# Patient Record
Sex: Female | Born: 1965 | Race: White | Hispanic: No | Smoking: Never smoker
Health system: Southern US, Community
[De-identification: ages and names within clinical notes are randomized; demographics above are authoritative.]

## PROBLEM LIST (undated history)

## (undated) DIAGNOSIS — Z8614 Personal history of Methicillin resistant Staphylococcus aureus infection: Secondary | ICD-10-CM

## (undated) DIAGNOSIS — I10 Essential (primary) hypertension: Secondary | ICD-10-CM

## (undated) DIAGNOSIS — F419 Anxiety disorder, unspecified: Secondary | ICD-10-CM

## (undated) HISTORY — PX: ABDOMINAL HYSTERECTOMY: SHX81

## (undated) HISTORY — PX: APPENDECTOMY: SHX54

---

## 2016-01-06 DIAGNOSIS — R569 Unspecified convulsions: Secondary | ICD-10-CM

## 2016-01-06 HISTORY — DX: Unspecified convulsions: R56.9

## 2017-07-29 DIAGNOSIS — F4310 Post-traumatic stress disorder, unspecified: Secondary | ICD-10-CM | POA: Insufficient documentation

## 2017-07-29 DIAGNOSIS — G4700 Insomnia, unspecified: Secondary | ICD-10-CM | POA: Insufficient documentation

## 2017-07-29 DIAGNOSIS — G43919 Migraine, unspecified, intractable, without status migrainosus: Secondary | ICD-10-CM | POA: Insufficient documentation

## 2017-08-21 DIAGNOSIS — F419 Anxiety disorder, unspecified: Secondary | ICD-10-CM | POA: Insufficient documentation

## 2017-08-21 DIAGNOSIS — F32A Depression, unspecified: Secondary | ICD-10-CM | POA: Insufficient documentation

## 2018-02-04 ENCOUNTER — Encounter: Payer: Self-pay | Admitting: Gastroenterology

## 2018-02-28 DIAGNOSIS — G40909 Epilepsy, unspecified, not intractable, without status epilepticus: Secondary | ICD-10-CM | POA: Insufficient documentation

## 2018-03-18 ENCOUNTER — Encounter: Payer: Self-pay | Admitting: Gastroenterology

## 2018-04-05 DIAGNOSIS — G43909 Migraine, unspecified, not intractable, without status migrainosus: Secondary | ICD-10-CM | POA: Insufficient documentation

## 2018-04-08 ENCOUNTER — Encounter: Payer: Self-pay | Admitting: Gastroenterology

## 2018-08-26 DIAGNOSIS — E785 Hyperlipidemia, unspecified: Secondary | ICD-10-CM | POA: Insufficient documentation

## 2018-11-16 ENCOUNTER — Encounter: Payer: Self-pay | Admitting: Adult Health

## 2018-12-12 DIAGNOSIS — I10 Essential (primary) hypertension: Secondary | ICD-10-CM | POA: Insufficient documentation

## 2018-12-15 ENCOUNTER — Ambulatory Visit: Payer: PRIVATE HEALTH INSURANCE | Admitting: Adult Health

## 2018-12-28 ENCOUNTER — Encounter (INDEPENDENT_AMBULATORY_CARE_PROVIDER_SITE_OTHER): Payer: Self-pay

## 2018-12-28 ENCOUNTER — Encounter: Payer: Self-pay | Admitting: Adult Health

## 2018-12-28 ENCOUNTER — Ambulatory Visit (INDEPENDENT_AMBULATORY_CARE_PROVIDER_SITE_OTHER): Payer: PRIVATE HEALTH INSURANCE | Admitting: Adult Health

## 2018-12-28 ENCOUNTER — Other Ambulatory Visit: Payer: Self-pay

## 2018-12-28 VITALS — BP 158/101 | HR 100 | Ht 63.0 in | Wt 245.0 lb

## 2018-12-28 DIAGNOSIS — F331 Major depressive disorder, recurrent, moderate: Secondary | ICD-10-CM | POA: Diagnosis not present

## 2018-12-28 DIAGNOSIS — F410 Panic disorder [episodic paroxysmal anxiety] without agoraphobia: Secondary | ICD-10-CM

## 2018-12-28 DIAGNOSIS — G4700 Insomnia, unspecified: Secondary | ICD-10-CM

## 2018-12-28 DIAGNOSIS — F4310 Post-traumatic stress disorder, unspecified: Secondary | ICD-10-CM | POA: Diagnosis not present

## 2018-12-28 DIAGNOSIS — F411 Generalized anxiety disorder: Secondary | ICD-10-CM | POA: Diagnosis not present

## 2018-12-28 MED ORDER — FLUOXETINE HCL 20 MG PO CAPS: ORAL_CAPSULE | ORAL | 2 refills | Status: DC

## 2018-12-28 MED ORDER — CLONAZEPAM 2 MG PO TABS: 2.0000 mg | ORAL_TABLET | Freq: Three times a day (TID) | ORAL | 2 refills | Status: DC

## 2018-12-28 NOTE — Progress Notes (Signed)
[facility] MD/PA/NP Initial Note  12/28/2018 4:29 PM Dana Kelly  MRN:  [id number]  Chief Complaint:  Chief Complaint    Anxiety; Depression; Panic Attack; Insomnia; Trauma      HPI:   Describes mood today as "ok". Pleasant. Mood symptoms - reports depression, anxiety, and irritability. More anxious overall - having panic attacks. Stating "I was doing better, but things have flared up more lately". Changed medications recently from Prozac to Zoloft. Was taking Prozac at 60mg  and was switched to Zoloft 50mg  daily. Zoloft increased to 100mg  and she did not tolerate it and was taken back down to 50mg . Increased migraines with feeling "bad". Getting scared and "doesn't know why". Wanting to "cry". Fears being alone. Husband walked out on her and their 84 year old son 5 years ago. Son cut his wrists after he found out father was leaving. Son ok now - in college. Has moved in with his father now. Father is a . History of "some" therapy.Stable interest and motivation. Taking medications as prescribed.  Energy levels decreased. Active, does not have a regular exercise routine. Works full-time - x 16 years. Enjoys some usual interests and activities. Lives alone - staying with mother some. Talking to son more. Appetite adequate. Weight stable. Sleeps well most nights. Averages 5 to 6 hours - "dreaming the entire time". Worse since starting the Zoloft.  Focus and concentration stable. Completing tasks. Managing aspects of household.  Denies SI or HI. Denies AH or VH.  Visit Diagnosis:    ICD-10-CM   1. PTSD (post-traumatic stress disorder)  F43.10   2. Generalized anxiety disorder  F41.1   3. Major depressive disorder, recurrent episode, moderate (HCC)  F33.1   4. Insomnia, unspecified type  G47.00   5. Panic attacks  F41.0     Past Psychiatric History: Denies psychiatric hospitalization.  Past Medical History: History reviewed. No pertinent past medical history.  History reviewed. No pertinent surgical history.  Family Psychiatric History: Denies any family history of mental illness.  Family History: History reviewed. No pertinent family history.  Social History:  Social History   Socioeconomic History  . Marital status: Divorced    Spouse name: Not on file  . Number of children: Not on file  . Years of education: Not on file  . Highest education level: Not on file  Occupational History  . Not on file  Tobacco Use  . Smoking status: Never Smoker  . Smokeless tobacco: Never Used  Substance and Sexual Activity  . Alcohol use: Not on file  . Drug use: Not on file  . Sexual activity: Not on file  Other Topics Concern  . Not on file  Social History Narrative  . Not on file   Social Determinants of Health   Financial Resource Strain:   . Difficulty of Paying Living Expenses: Not on file  Food Insecurity:   . Worried About in the Last Year: Not on file  . Ran Out of Food in the Last Year: Not on file  Transportation Needs:   . Lack of Transportation (Medical): Not on file  . Lack of Transportation (Non-Medical): Not on file  Physical Activity:   . Days of Exercise per Week: Not on file  . Minutes of Exercise per Session: Not on file  Stress:   . Feeling of Stress : Not on file  Social Connections:   . Frequency of Communication with Friends and Family: Not on file  .  Frequency of Social Gatherings with Friends and Family: Not on file  . Attends Religious Services: Not on file  . Active Member of Clubs or Organizations: Not on file  . Attends BankerClub or Organization Meetings: Not on file  . Marital Status: Not on file    Allergies:  Allergies  Allergen Reactions  . Dicyclomine Hives  . Peanut-Containing Drug Products Anaphylaxis    Other reaction(s): Anaphylaxis  . Hydromorphone Hcl Other (See Comments)  . Phenytoin Sodium Extended     Decreases blood pressure  . Pregabalin Other (See Comments)    Other  reaction(s): Other (See Comments) Amnesiac episodes   . Sulfa Antibiotics   . Hydromorphone Itching    Other reaction(s): Itching  . Meloxicam Nausea And Vomiting    Metabolic Disorder Labs: No results found for: HGBA1C, MPG No results found for: PROLACTIN No results found for: CHOL, TRIG, HDL, CHOLHDL, VLDL, LDLCALC No results found for: TSH  Therapeutic Level Labs: No results found for: LITHIUM No results found for: VALPROATE No components found for:  CBMZ  Current Medications: Current Outpatient Medications  Medication Sig Dispense Refill  . amoxicillin-clavulanate (AUGMENTIN) 500-125 MG tablet amoxicillin 500 mg-potassium clavulanate 125 mg tablet    . clonazePAM (KLONOPIN) 2 MG tablet clonazepam 2 mg tablet    . furosemide (LASIX) 40 MG tablet furosemide 40 mg tablet  TAKE ONE TABLET BY MOUTH EVERY OTHER DAY AS DIRECTED    . lisinopril (ZESTRIL) 40 MG tablet lisinopril 40 mg tablet    . ondansetron (ZOFRAN-ODT) 4 MG disintegrating tablet ondansetron 4 mg disintegrating tablet    . prochlorperazine (COMPAZINE) 10 MG tablet prochlorperazine maleate 10 mg tablet    . promethazine (PHENERGAN) 25 MG tablet promethazine 25 mg tablet    . sertraline (ZOLOFT) 100 MG tablet Zoloft 100 mg tablet  Take 0.5 tablets every day by oral route.    . SUMAtriptan (IMITREX) 100 MG tablet Take by mouth.     No current facility-administered medications for this visit.    Medication Side Effects: none  Orders placed this visit:  No orders of the defined types were placed in this encounter.   Psychiatric Specialty Exam:  Review of Systems  Musculoskeletal: Negative for gait problem.  Neurological: Negative for tremors.  Psychiatric/Behavioral:       Please refer to HPI    Blood pressure (!) 158/101, pulse 100, height 5\' 3"  (1.6 m), weight 245 lb (111.1 kg).Body mass index is 43.4 kg/m.  General Appearance: Neat and Well Groomed  Eye Contact:  Good  Speech:  Clear and Coherent and  Normal Rate  Volume:  Normal  Mood:  Anxious and Depressed  Affect:  Congruent, Depressed and Flat  Thought Process:  Coherent and Descriptions of Associations: Intact  Orientation:  Full (Time, Place, and Person)  Thought Content: Logical   Suicidal Thoughts:  No  Homicidal Thoughts:  No  Memory:  WNL  Judgement:  Good  Insight:  Good  Psychomotor Activity:  Normal  Concentration:  Concentration: Good  Recall:  Good  Fund of Knowledge: Good  Language: Good  Assets:  Communication Skills Desire for Improvement Financial Resources/Insurance Housing Intimacy Leisure Time Physical Health Resilience Social Support Talents/Skills Transportation Vocational/Educational  ADL's:  Intact  Cognition: WNL  Prognosis:  Good   Screenings: Done  Receiving Psychotherapy: No   Treatment Plan/Recommendations:   Plan:  1. Clonazepam 2mg  TID  2. Zoloft 50mg  daily - take 1/2 tablet daily for 7 days, then discontinue. 3. Restart  Prozac 20mg  - plan to increase to 60mg .  Consider Adderall for ADHD - testing.  RTC 4 weeks  Patient advised to contact office with any questions, adverse effects, or acute worsening in signs and symptoms.    Aloha Gell, NP

## 2019-01-18 ENCOUNTER — Telehealth: Payer: Self-pay | Admitting: Adult Health

## 2019-01-18 NOTE — Telephone Encounter (Signed)
Called and LVM for patient to RC.

## 2019-01-18 NOTE — Telephone Encounter (Signed)
Patient called and said that she is having issues. She said that she needs something to go with the medicine you gave her. She is crying all the time and very anxious and nervous. She has an appt on 1/20 but doesn't want to wait until then to get the medicine. Her pharmacy is [facility] in [place]. Please give her a call

## 2019-01-19 ENCOUNTER — Other Ambulatory Visit: Payer: Self-pay | Admitting: Adult Health

## 2019-01-19 DIAGNOSIS — F4310 Post-traumatic stress disorder, unspecified: Secondary | ICD-10-CM

## 2019-01-19 DIAGNOSIS — G4700 Insomnia, unspecified: Secondary | ICD-10-CM

## 2019-01-19 DIAGNOSIS — F411 Generalized anxiety disorder: Secondary | ICD-10-CM

## 2019-01-19 MED ORDER — OLANZAPINE 5 MG PO TABS: 5.0000 mg | ORAL_TABLET | Freq: Every day | ORAL | 0 refills | Status: DC

## 2019-01-19 NOTE — Telephone Encounter (Signed)
Pt returned call from yesterday. Please call her back @ [phone].

## 2019-01-19 NOTE — Telephone Encounter (Signed)
Called and spoke w patient.

## 2019-01-25 ENCOUNTER — Encounter: Payer: Self-pay | Admitting: Adult Health

## 2019-01-25 ENCOUNTER — Other Ambulatory Visit: Payer: Self-pay

## 2019-01-25 ENCOUNTER — Ambulatory Visit (INDEPENDENT_AMBULATORY_CARE_PROVIDER_SITE_OTHER): Payer: PRIVATE HEALTH INSURANCE | Admitting: Adult Health

## 2019-01-25 DIAGNOSIS — F411 Generalized anxiety disorder: Secondary | ICD-10-CM

## 2019-01-25 DIAGNOSIS — F331 Major depressive disorder, recurrent, moderate: Secondary | ICD-10-CM | POA: Diagnosis not present

## 2019-01-25 DIAGNOSIS — G4700 Insomnia, unspecified: Secondary | ICD-10-CM

## 2019-01-25 DIAGNOSIS — F4310 Post-traumatic stress disorder, unspecified: Secondary | ICD-10-CM

## 2019-01-25 DIAGNOSIS — F410 Panic disorder [episodic paroxysmal anxiety] without agoraphobia: Secondary | ICD-10-CM | POA: Diagnosis not present

## 2019-01-25 DIAGNOSIS — R4689 Other symptoms and signs involving appearance and behavior: Secondary | ICD-10-CM | POA: Insufficient documentation

## 2019-01-25 DIAGNOSIS — F909 Attention-deficit hyperactivity disorder, unspecified type: Secondary | ICD-10-CM

## 2019-01-25 DIAGNOSIS — R404 Transient alteration of awareness: Secondary | ICD-10-CM | POA: Insufficient documentation

## 2019-01-25 MED ORDER — TRAZODONE HCL 100 MG PO TABS: ORAL_TABLET | ORAL | 2 refills | Status: DC

## 2019-01-25 MED ORDER — LISDEXAMFETAMINE DIMESYLATE 30 MG PO CAPS: 30.0000 mg | ORAL_CAPSULE | Freq: Every day | ORAL | 0 refills | Status: DC

## 2019-01-25 NOTE — Progress Notes (Signed)
Dana Kelly [id number] [birth year] 54 y.o.  Subjective:   Patient ID:  Dana Kelly is a 54 y.o. (DOB 03/03/1965) female.  Chief Complaint:  Chief Complaint  Patient presents with  . Depression  . Anxiety  . ADHD  . Insomnia  . Panic Attack  . Other    PTSD    HPI Dana Kelly presents to the office today for follow-up of MDD, GAD, insomnia, panic attacks, ADHD, and PTSD.  Describes mood today as "ok". Pleasant. Denies tearfulness x 2 weeks. Mood symptoms - reports decreased depression, anxiety, and irritability. More anxious overall. Having having panic attacks. Stating "I feel a little better". One Migraine since last visit -"had to get a shot for it". Took Zyprexa for 2 days - "made her too draggy during the day and didn't sleep well at night". Has taken some "old Trazadone to help with sleep". Would like to try medication for ADHD. Feels like she is "struggling" at work. Concerns of losing her job. Was trialed on Ritalin previously, but insurance refused to cover it. Stable interest and motivation. Taking medications as prescribed.  Energy levels decreased. Active, does not have a regular exercise routine. 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 Lives alone. Stays with mother some days. Talking to son more. Appetite adequate. Weight stable - 245. Sleeps well most nights. Averages 4 to 5 hours. Focus and concentration difficulties. Completing tasks. Managing aspects of household. Difficulties in work setting - "getting into trouble".  Denies SI or HI. Denies AH or VH.  Review of Systems:  Review of Systems  Musculoskeletal: Negative for gait problem.  Neurological: Negative for tremors.  Psychiatric/Behavioral:       Please refer to HPI    Medications: I have reviewed the patient's current medications.  Current Outpatient Medications  Medication Sig Dispense Refill  . amoxicillin-clavulanate (AUGMENTIN) 500-125 MG  tablet amoxicillin 500 mg-potassium clavulanate 125 mg tablet    . clonazePAM (KLONOPIN) 2 MG tablet Take 1 tablet (2 mg total) by mouth 3 (three) times daily. 90 tablet 2  . FLUoxetine (PROZAC) 20 MG capsule Take 3 capsules daily. 90 capsule 2  . furosemide (LASIX) 40 MG tablet furosemide 40 mg tablet  TAKE ONE TABLET BY MOUTH EVERY OTHER DAY AS DIRECTED    . lisdexamfetamine (VYVANSE) 30 MG capsule Take 1 capsule (30 mg total) by mouth daily. 30 capsule 0  . lisinopril (ZESTRIL) 40 MG tablet lisinopril 40 mg tablet    . ondansetron (ZOFRAN-ODT) 4 MG disintegrating tablet ondansetron 4 mg disintegrating tablet    . prochlorperazine (COMPAZINE) 10 MG tablet prochlorperazine maleate 10 mg tablet    . promethazine (PHENERGAN) 25 MG tablet promethazine 25 mg tablet    . SUMAtriptan (IMITREX) 100 MG tablet Take by mouth.    . traZODone (DESYREL) 100 MG tablet Take two tablets at bedtime. 60 tablet 2   No current facility-administered medications for this visit.    Medication Side Effects: None  Allergies:  Allergies  Allergen Reactions  . Dicyclomine Hives  . Peanut-Containing Drug Products Anaphylaxis    Other reaction(s): Anaphylaxis  . Hydromorphone Hcl Other (See Comments)  . Phenytoin Sodium Extended     Decreases blood pressure  . Pregabalin Other (See Comments)    Other reaction(s): Other (See Comments) Amnesiac episodes   . Sulfa Antibiotics   . Hydromorphone Itching    Other reaction(s): Itching  . Meloxicam Nausea And Vomiting    No past  medical history on file.  No family history on file.  Social History   Socioeconomic History  . Marital status: Divorced    Spouse name: Not on file  . Number of children: Not on file  . Years of education: Not on file  . Highest education level: Not on file  Occupational History  . Not on file  Tobacco Use  . Smoking status: Never Smoker  . Smokeless tobacco: Never Used  Substance and Sexual Activity  . Alcohol use: Not  on file  . Drug use: Not on file  . Sexual activity: Not on file  Other Topics Concern  . Not on file  Social History Narrative  . Not on file   Social Determinants of Health   Financial Resource Strain:   . Difficulty of Paying Living Expenses: Not on file  Food Insecurity:   . Worried About Charity fundraiser in the Last Year: Not on file  . Ran Out of Food in the Last Year: Not on file  Transportation Needs:   . Lack of Transportation (Medical): Not on file  . Lack of Transportation (Non-Medical): Not on file  Physical Activity:   . Days of Exercise per Week: Not on file  . Minutes of Exercise per Session: Not on file  Stress:   . Feeling of Stress : Not on file  Social Connections:   . Frequency of Communication with Friends and Family: Not on file  . Frequency of Social Gatherings with Friends and Family: Not on file  . Attends Religious Services: Not on file  . Active Member of Clubs or Organizations: Not on file  . Attends Archivist Meetings: Not on file  . Marital Status: Not on file  Intimate Partner Violence:   . Fear of Current or Ex-Partner: Not on file  . Emotionally Abused: Not on file  . Physically Abused: Not on file  . Sexually Abused: Not on file    Past Medical History, Surgical history, Social history, and Family history were reviewed and updated as appropriate.   Please see review of systems for further details on the patient's review from today.   Objective:   Physical Exam:  There were no vitals taken for this visit.  Physical Exam Constitutional:      General: She is not in acute distress.    Appearance: She is well-developed.  Musculoskeletal:        General: No deformity.  Neurological:     Mental Status: She is alert and oriented to person, place, and time.     Coordination: Coordination normal.  Psychiatric:        Attention and Perception: Attention and perception normal. She does not perceive auditory or visual  hallucinations.        Mood and Affect: Mood normal. Mood is not anxious or depressed. Affect is not labile, blunt, angry or inappropriate.        Speech: Speech normal.        Behavior: Behavior normal.        Thought Content: Thought content normal. Thought content is not paranoid or delusional. Thought content does not include homicidal or suicidal ideation. Thought content does not include homicidal or suicidal plan.        Cognition and Memory: Cognition and memory normal.        Judgment: Judgment normal.     Comments: Insight intact     Lab Review:  No results found for: NA, K, CL, CO2,  GLUCOSE, BUN, CREATININE, CALCIUM, PROT, ALBUMIN, AST, ALT, ALKPHOS, BILITOT, GFRNONAA, GFRAA  No results found for: WBC, RBC, HGB, HCT, PLT, MCV, MCH, MCHC, RDW, LYMPHSABS, MONOABS, EOSABS, BASOSABS  No results found for: POCLITH, LITHIUM   No results found for: PHENYTOIN, PHENOBARB, VALPROATE, CBMZ   .res Assessment: Plan:    Plan:  Clonazepam 2mg  TID  Prozac 60mg  Add Vyvanse 30mg  daily Add Trazdone 100mg  at hs - take one to 2 tablets.  Consider Adderall for ADHD - testing completed. Testing brought to appointment. RTC 4 weeks  Patient advised to contact office with any questions, adverse effects, or acute worsening in signs and symptoms.   Jaid was seen today for depression, anxiety, adhd, insomnia, panic attack and other.  Diagnoses and all orders for this visit:  Panic attacks  Major depressive disorder, recurrent episode, moderate (HCC)  Generalized anxiety disorder  Insomnia, unspecified type -     traZODone (DESYREL) 100 MG tablet; Take two tablets at bedtime.  PTSD (post-traumatic stress disorder)  Attention deficit hyperactivity disorder (ADHD), unspecified ADHD type -     lisdexamfetamine (VYVANSE) 30 MG capsule; Take 1 capsule (30 mg total) by mouth daily.     Please see After Visit Summary for patient specific instructions.  Future Appointments  Date  Time Provider Department Center  02/22/2019  2:40 PM Sharin Altidor, , NP [facility] None    No orders of the defined types were placed in this encounter.   -------------------------------

## 2019-01-26 ENCOUNTER — Telehealth: Payer: Self-pay | Admitting: Adult Health

## 2019-01-26 DIAGNOSIS — F909 Attention-deficit hyperactivity disorder, unspecified type: Secondary | ICD-10-CM

## 2019-01-26 MED ORDER — AMPHETAMINE-DEXTROAMPHETAMINE 20 MG PO TABS: 20.0000 mg | ORAL_TABLET | Freq: Every day | ORAL | 0 refills | Status: DC

## 2019-01-26 NOTE — Telephone Encounter (Signed)
I will send in Adderall 20mg  daily - start with 1/2 tablet and then increase to one tablet.

## 2019-01-26 NOTE — Telephone Encounter (Signed)
We can - would she want to try an Adderall XR 20mg ?

## 2019-01-26 NOTE — Telephone Encounter (Signed)
Patient given instructions and to follow up with an update

## 2019-01-26 NOTE — Telephone Encounter (Signed)
Pharmacy, [facility] in [place], says she can't get Vyvvanse due to insurance not covering, She  Has to try Adderall first per insurance. Can we sent that in?

## 2019-02-09 ENCOUNTER — Telehealth: Payer: Self-pay | Admitting: Adult Health

## 2019-02-09 NOTE — Telephone Encounter (Signed)
Aida Puffer left message from Clinic in [place] about Keonta Monceaux asking what meds she should be taking. RETURN CALL TO [phone]

## 2019-02-09 NOTE — Telephone Encounter (Signed)
I don't have her on either of those. Stopped Zyprexa. Never started Prazosin.

## 2019-02-09 NOTE — Telephone Encounter (Signed)
Can we get some clarity on this request. Patient should be able to relay that information.

## 2019-02-09 NOTE — Telephone Encounter (Signed)
I didn't have her on it, but she may be taking it.

## 2019-02-10 NOTE — Telephone Encounter (Signed)
Rtc to Dana Kelly to discuss medications but they were having computer issues and she was unable to talk. They will have her call back when she gets a chance.

## 2019-02-10 NOTE — Telephone Encounter (Signed)
Spoke with Jacki Cones this morning, she will be going by patient's house today and over the weekend to follow up on her sepsis. She will have her discontinue the Zyprexa. She will also discuss with her if she still needs the prazosin for nightmares, she may have her omit it this weekend to do a trial to see if it is needed. She will then follow up and let us know on Monday.

## 2019-02-22 ENCOUNTER — Encounter: Payer: Self-pay | Admitting: Adult Health

## 2019-02-22 ENCOUNTER — Ambulatory Visit (INDEPENDENT_AMBULATORY_CARE_PROVIDER_SITE_OTHER): Payer: PRIVATE HEALTH INSURANCE | Admitting: Adult Health

## 2019-02-22 DIAGNOSIS — F331 Major depressive disorder, recurrent, moderate: Secondary | ICD-10-CM | POA: Diagnosis not present

## 2019-02-22 DIAGNOSIS — F411 Generalized anxiety disorder: Secondary | ICD-10-CM | POA: Diagnosis not present

## 2019-02-22 DIAGNOSIS — F4310 Post-traumatic stress disorder, unspecified: Secondary | ICD-10-CM

## 2019-02-22 DIAGNOSIS — F909 Attention-deficit hyperactivity disorder, unspecified type: Secondary | ICD-10-CM

## 2019-02-22 DIAGNOSIS — G4700 Insomnia, unspecified: Secondary | ICD-10-CM | POA: Diagnosis not present

## 2019-02-22 DIAGNOSIS — F410 Panic disorder [episodic paroxysmal anxiety] without agoraphobia: Secondary | ICD-10-CM

## 2019-02-22 MED ORDER — AMPHETAMINE-DEXTROAMPHETAMINE 30 MG PO TABS: 30.0000 mg | ORAL_TABLET | Freq: Every day | ORAL | 0 refills | Status: DC

## 2019-02-22 NOTE — Progress Notes (Signed)
Dana Kelly [id number] July 26, 1965 54 y.o.  Virtual Visit via Telephone Note  I connected with pt on 02/22/19 at  2:40 PM EST by telephone and verified that I am speaking with the correct person using two identifiers.   I discussed the limitations, risks, security and privacy concerns of performing an evaluation and management service by telephone and the availability of in person appointments. I also discussed with the patient that there may be a patient responsible charge related to this service. The patient expressed understanding and agreed to proceed.   I discussed the assessment and treatment plan with the patient. The patient was provided an opportunity to ask questions and all were answered. The patient agreed with the plan and demonstrated an understanding of the instructions.   The patient was advised to call back or seek an in-person evaluation if the symptoms worsen or if the condition fails to improve as anticipated.  I provided 30 minutes of non-face-to-face time during this encounter.  The patient was located at home.  The provider was located at [facility].   [name], NP   Subjective:   Patient ID:  Dana Kelly is a 54 y.o. (DOB 04-14-1965) female.  Chief Complaint: No chief complaint on file.   HPI Dana Kelly presents for follow-up of MDD, GAD, insomnia, panic attacks, ADHD, and PTSD.  Describes mood today as "ok". Pleasant. Denies tearfulness x 2 weeks. Mood symptoms - reports decreased depression, anxiety, and irritability. Stating "I'm moving towards getting better". Recently hospitalized for a week for Sepsis. Was out of work for 2 weeks. Returned to work a week ago. Feels like she is "recovering well". Still feeling tired and fatigued. Able to work, then going home and going to bed. Following up with PCP. Stable interest and motivation. Taking medications as prescribed.  Energy levels low. Active, does not have a regular  exercise routine. 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 Lives alone. Stays with mother some days. Talking to son more. Appetite adequate. Weight loss while hospitalized - 240 pounds. Sleeps well most nights. Averages 4 to 5 hours. Waking up "off and on". Having nightmares.  Focus and concentration difficulties. Completing tasks. Managing aspects of household. Work going well.  Denies SI or HI. Denies AH or VH.  Review of Systems:  Review of Systems  Musculoskeletal: Negative for gait problem.  Neurological: Negative for tremors.  Psychiatric/Behavioral:       Please refer to HPI    Medications: I have reviewed the patient's current medications.  Current Outpatient Medications  Medication Sig Dispense Refill  . amoxicillin-clavulanate (AUGMENTIN) 500-125 MG tablet amoxicillin 500 mg-potassium clavulanate 125 mg tablet    . amphetamine-dextroamphetamine (ADDERALL) 20 MG tablet Take 1 tablet (20 mg total) by mouth daily. 30 tablet 0  . clonazePAM (KLONOPIN) 2 MG tablet Take 1 tablet (2 mg total) by mouth 3 (three) times daily. 90 tablet 2  . FLUoxetine (PROZAC) 20 MG capsule Take 3 capsules daily. 90 capsule 2  . furosemide (LASIX) 40 MG tablet furosemide 40 mg tablet  TAKE ONE TABLET BY MOUTH EVERY OTHER DAY AS DIRECTED    . lisinopril (ZESTRIL) 40 MG tablet lisinopril 40 mg tablet    . ondansetron (ZOFRAN-ODT) 4 MG disintegrating tablet ondansetron 4 mg disintegrating tablet    . prochlorperazine (COMPAZINE) 10 MG tablet prochlorperazine maleate 10 mg tablet    . promethazine (PHENERGAN) 25 MG tablet promethazine 25 mg tablet    .  SUMAtriptan (IMITREX) 100 MG tablet Take by mouth.    . traZODone (DESYREL) 100 MG tablet Take two tablets at bedtime. 60 tablet 2   No current facility-administered medications for this visit.    Medication Side Effects: None  Allergies:  Allergies  Allergen Reactions  . Dicyclomine Hives  .  Peanut-Containing Drug Products Anaphylaxis    Other reaction(s): Anaphylaxis  . Hydromorphone Hcl Other (See Comments)  . Phenytoin Sodium Extended     Decreases blood pressure  . Pregabalin Other (See Comments)    Other reaction(s): Other (See Comments) Amnesiac episodes   . Sulfa Antibiotics   . Hydromorphone Itching    Other reaction(s): Itching  . Meloxicam Nausea And Vomiting    No past medical history on file.  No family history on file.  Social History   Socioeconomic History  . Marital status: Divorced    Spouse name: Not on file  . Number of children: Not on file  . Years of education: Not on file  . Highest education level: Not on file  Occupational History  . Not on file  Tobacco Use  . Smoking status: Never Smoker  . Smokeless tobacco: Never Used  Substance and Sexual Activity  . Alcohol use: Not on file  . Drug use: Not on file  . Sexual activity: Not on file  Other Topics Concern  . Not on file  Social History Narrative  . Not on file   Social Determinants of Health   Financial Resource Strain:   . Difficulty of Paying Living Expenses: Not on file  Food Insecurity:   . Worried About Charity fundraiser in the Last Year: Not on file  . Ran Out of Food in the Last Year: Not on file  Transportation Needs:   . Lack of Transportation (Medical): Not on file  . Lack of Transportation (Non-Medical): Not on file  Physical Activity:   . Days of Exercise per Week: Not on file  . Minutes of Exercise per Session: Not on file  Stress:   . Feeling of Stress : Not on file  Social Connections:   . Frequency of Communication with Friends and Family: Not on file  . Frequency of Social Gatherings with Friends and Family: Not on file  . Attends Religious Services: Not on file  . Active Member of Clubs or Organizations: Not on file  . Attends Archivist Meetings: Not on file  . Marital Status: Not on file  Intimate Partner Violence:   . Fear of  Current or Ex-Partner: Not on file  . Emotionally Abused: Not on file  . Physically Abused: Not on file  . Sexually Abused: Not on file    Past Medical History, Surgical history, Social history, and Family history were reviewed and updated as appropriate.   Please see review of systems for further details on the patient's review from today.   Objective:   Physical Exam:  There were no vitals taken for this visit.  Physical Exam Constitutional:      General: She is not in acute distress.    Appearance: She is well-developed.  Musculoskeletal:        General: No deformity.  Neurological:     Mental Status: She is alert and oriented to person, place, and time.     Coordination: Coordination normal.  Psychiatric:        Attention and Perception: Attention and perception normal. She does not perceive auditory or visual hallucinations.  Mood and Affect: Mood normal. Mood is not anxious or depressed. Affect is not labile, blunt, angry or inappropriate.        Speech: Speech normal.        Behavior: Behavior normal.        Thought Content: Thought content normal. Thought content is not paranoid or delusional. Thought content does not include homicidal or suicidal ideation. Thought content does not include homicidal or suicidal plan.        Cognition and Memory: Cognition and memory normal.        Judgment: Judgment normal.     Comments: Insight intact     Lab Review:  No results found for: NA, K, CL, CO2, GLUCOSE, BUN, CREATININE, CALCIUM, PROT, ALBUMIN, AST, ALT, ALKPHOS, BILITOT, GFRNONAA, GFRAA  No results found for: WBC, RBC, HGB, HCT, PLT, MCV, MCH, MCHC, RDW, LYMPHSABS, MONOABS, EOSABS, BASOSABS  No results found for: POCLITH, LITHIUM   No results found for: PHENYTOIN, PHENOBARB, VALPROATE, CBMZ   .res Assessment: Plan:    Plan:  Clonazepam 2mg  TID  Prozac 60mg  Increase Adderall 20mg  to 30mg  daily Trazdone 100mg  at hs - take one to 2 tablets.  RTC 4  weeks  Patient advised to contact office with any questions, adverse effects, or acute worsening in signs and symptoms.  Discussed potential benefits, risk, and side effects of benzodiazepines to include potential risk of tolerance and dependence, as well as possible drowsiness.  Advised patient not to drive if experiencing drowsiness and to take lowest possible effective dose to minimize risk of dependence and tolerance.  Discussed potential metabolic side effects associated with atypical antipsychotics, as well as potential risk for movement side effects. Advised pt to contact office if movement side effects occur.   Diagnoses and all orders for this visit:  PTSD (post-traumatic stress disorder)  Insomnia, unspecified type  Generalized anxiety disorder  Major depressive disorder, recurrent episode, moderate (HCC)  Panic attacks  Attention deficit hyperactivity disorder (ADHD), unspecified ADHD type    Please see After Visit Summary for patient specific instructions.  No future appointments.  No orders of the defined types were placed in this encounter.     -------------------------------

## 2019-03-22 ENCOUNTER — Ambulatory Visit (INDEPENDENT_AMBULATORY_CARE_PROVIDER_SITE_OTHER): Payer: PRIVATE HEALTH INSURANCE | Admitting: Adult Health

## 2019-03-22 ENCOUNTER — Encounter: Payer: Self-pay | Admitting: Adult Health

## 2019-03-22 DIAGNOSIS — F410 Panic disorder [episodic paroxysmal anxiety] without agoraphobia: Secondary | ICD-10-CM | POA: Diagnosis not present

## 2019-03-22 DIAGNOSIS — F4310 Post-traumatic stress disorder, unspecified: Secondary | ICD-10-CM

## 2019-03-22 DIAGNOSIS — G4700 Insomnia, unspecified: Secondary | ICD-10-CM | POA: Diagnosis not present

## 2019-03-22 DIAGNOSIS — F331 Major depressive disorder, recurrent, moderate: Secondary | ICD-10-CM | POA: Diagnosis not present

## 2019-03-22 DIAGNOSIS — F411 Generalized anxiety disorder: Secondary | ICD-10-CM

## 2019-03-22 DIAGNOSIS — F909 Attention-deficit hyperactivity disorder, unspecified type: Secondary | ICD-10-CM

## 2019-03-22 MED ORDER — AMPHETAMINE-DEXTROAMPHETAMINE 30 MG PO TABS: 30.0000 mg | ORAL_TABLET | Freq: Every day | ORAL | 0 refills | Status: DC

## 2019-03-22 MED ORDER — CLONAZEPAM 2 MG PO TABS: 2.0000 mg | ORAL_TABLET | Freq: Three times a day (TID) | ORAL | 2 refills | Status: DC

## 2019-03-22 MED ORDER — FLUOXETINE HCL 20 MG PO CAPS: ORAL_CAPSULE | ORAL | 2 refills | Status: DC

## 2019-03-22 MED ORDER — TRAZODONE HCL 100 MG PO TABS: ORAL_TABLET | ORAL | 2 refills | Status: DC

## 2019-03-22 NOTE — Progress Notes (Signed)
Dana Kelly [id number] [birth year] 54 y.o.  Virtual Visit via Telephone Note  I connected with pt on 03/22/19 at  4:40 PM EDT by telephone and verified that I am speaking with the correct person using two identifiers.   I discussed the limitations, risks, security and privacy concerns of performing an evaluation and management service by telephone and the availability of in person appointments. I also discussed with the patient that there may be a patient responsible charge related to this service. The patient expressed understanding and agreed to proceed.   I discussed the assessment and treatment plan with the patient. The patient was provided an opportunity to ask questions and all were answered. The patient agreed with the plan and demonstrated an understanding of the instructions.   The patient was advised to call back or seek an in-person evaluation if the symptoms worsen or if the condition fails to improve as anticipated.  I provided 30 minutes of non-face-to-face time during this encounter.  The patient was located at home.  The provider was located at [facility].   [name], NP   Subjective:   Patient ID:  Dana Kelly is a 54 y.o. (DOB Mar 27, 1965) female.  Chief Complaint: No chief complaint on file.   HPI Dana Kelly presents for follow-up of MDD, GAD, insomnia, panic attacks, ADHD, and PTSD.  Describes mood today as "ok". Pleasant. Denies tearfulness x 2 weeks. Mood symptoms - reports decreased depression, anxiety, and irritability. Stating "I feel like I'm getting back into full swing. Getting a "little anxious" now and then. Stating "the periods of crying are getting farther and farther apart". Stating "I'm not having any crying spells". Feels tired and fatigued - 2 blood clots in lungs - "will take a year to recover". Following up with PCP. Stable interest and motivation. Taking medications as prescribed.  Energy levels low. Active,  does not have a regular exercise routine. 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 Lives alone. Has started "talking to someone".  Appetite adequate. Weight stable - 240 pounds. Sleeps well most nights. Averages 5 to 6 hours. Waking up during the night for no particular reason. Decreased nightmares.  Focus and concentration difficulties. Completing tasks. Managing aspects of household. Work going well.  Denies SI or HI. Denies AH or VH.  Review of Systems:  Review of Systems  Musculoskeletal: Negative for gait problem.  Neurological: Negative for tremors.  Psychiatric/Behavioral:       Please refer to HPI    Medications: I have reviewed the patient's current medications.  Current Outpatient Medications  Medication Sig Dispense Refill  . amoxicillin-clavulanate (AUGMENTIN) 500-125 MG tablet amoxicillin 500 mg-potassium clavulanate 125 mg tablet    . amphetamine-dextroamphetamine (ADDERALL) 30 MG tablet Take 1 tablet by mouth daily. 30 tablet 0  . apixaban (ELIQUIS) 5 MG TABS tablet Eliquis 5 mg tablet  1 tab BID    . clonazePAM (KLONOPIN) 2 MG tablet Take 1 tablet (2 mg total) by mouth 3 (three) times daily. 90 tablet 2  . FLUoxetine (PROZAC) 20 MG capsule Take 3 capsules daily. 90 capsule 2  . furosemide (LASIX) 40 MG tablet furosemide 40 mg tablet  TAKE ONE TABLET BY MOUTH EVERY OTHER DAY AS DIRECTED    . lisinopril (ZESTRIL) 40 MG tablet lisinopril 40 mg tablet    . ondansetron (ZOFRAN-ODT) 4 MG disintegrating tablet ondansetron 4 mg disintegrating tablet    . prazosin (MINIPRESS) 2 MG capsule prazosin  2 mg capsule    . prochlorperazine (COMPAZINE) 10 MG tablet prochlorperazine maleate 10 mg tablet    . promethazine (PHENERGAN) 25 MG tablet promethazine 25 mg tablet    . SUMAtriptan (IMITREX) 100 MG tablet Take by mouth.    . traZODone (DESYREL) 100 MG tablet Take two tablets at bedtime. 60 tablet 2   No current facility-administered  medications for this visit.    Medication Side Effects: None  Allergies:  Allergies  Allergen Reactions  . Dicyclomine Hives  . Peanut-Containing Drug Products Anaphylaxis    Other reaction(s): Anaphylaxis  . Hydromorphone Hcl Other (See Comments)  . Phenytoin Sodium Extended     Decreases blood pressure  . Pregabalin Other (See Comments)    Other reaction(s): Other (See Comments) Amnesiac episodes   . Sulfa Antibiotics   . Hydromorphone Itching    Other reaction(s): Itching  . Meloxicam Nausea And Vomiting    No past medical history on file.  No family history on file.  Social History   Socioeconomic History  . Marital status: Divorced    Spouse name: Not on file  . Number of children: Not on file  . Years of education: Not on file  . Highest education level: Not on file  Occupational History  . Not on file  Tobacco Use  . Smoking status: Never Smoker  . Smokeless tobacco: Never Used  Substance and Sexual Activity  . Alcohol use: Not on file  . Drug use: Not on file  . Sexual activity: Not on file  Other Topics Concern  . Not on file  Social History Narrative  . Not on file   Social Determinants of Health   Financial Resource Strain:   . Difficulty of Paying Living Expenses:   Food Insecurity:   . Worried About Programme researcher, broadcasting/film/video in the Last Year:   . Barista in the Last Year:   Transportation Needs:   . Freight forwarder (Medical):   Marland Kitchen Lack of Transportation (Non-Medical):   Physical Activity:   . Days of Exercise per Week:   . Minutes of Exercise per Session:   Stress:   . Feeling of Stress :   Social Connections:   . Frequency of Communication with Friends and Family:   . Frequency of Social Gatherings with Friends and Family:   . Attends Religious Services:   . Active Member of Clubs or Organizations:   . Attends Banker Meetings:   Marland Kitchen Marital Status:   Intimate Partner Violence:   . Fear of Current or Ex-Partner:    . Emotionally Abused:   Marland Kitchen Physically Abused:   . Sexually Abused:     Past Medical History, Surgical history, Social history, and Family history were reviewed and updated as appropriate.   Please see review of systems for further details on the patient's review from today.   Objective:   Physical Exam:  There were no vitals taken for this visit.  Physical Exam Constitutional:      General: She is not in acute distress. Musculoskeletal:        General: No deformity.  Neurological:     Mental Status: She is alert and oriented to person, place, and time.     Coordination: Coordination normal.  Psychiatric:        Attention and Perception: Attention and perception normal. She does not perceive auditory or visual hallucinations.        Mood and Affect: Mood normal. Mood  is not anxious or depressed. Affect is not labile, blunt, angry or inappropriate.        Speech: Speech normal.        Behavior: Behavior normal.        Thought Content: Thought content normal. Thought content is not paranoid or delusional. Thought content does not include homicidal or suicidal ideation. Thought content does not include homicidal or suicidal plan.        Cognition and Memory: Cognition and memory normal.        Judgment: Judgment normal.     Comments: Insight intact     Lab Review:  No results found for: NA, K, CL, CO2, GLUCOSE, BUN, CREATININE, CALCIUM, PROT, ALBUMIN, AST, ALT, ALKPHOS, BILITOT, GFRNONAA, GFRAA  No results found for: WBC, RBC, HGB, HCT, PLT, MCV, MCH, MCHC, RDW, LYMPHSABS, MONOABS, EOSABS, BASOSABS  No results found for: POCLITH, LITHIUM   No results found for: PHENYTOIN, PHENOBARB, VALPROATE, CBMZ   .res Assessment: Plan:    Plan:  Clonazepam 2mg  TID  Prozac 60mg  Adderall 30mg  daily Trazdone 100mg  at hs - take one to 3 tablets.  RTC 4 weeks  Patient advised to contact office with any questions, adverse effects, or acute worsening in signs and  symptoms.  Discussed potential benefits, risk, and side effects of benzodiazepines to include potential risk of tolerance and dependence, as well as possible drowsiness.  Advised patient not to drive if experiencing drowsiness and to take lowest possible effective dose to minimize risk of dependence and tolerance.  Discussed potential metabolic side effects associated with atypical antipsychotics, as well as potential risk for movement side effects. Advised pt to contact office if movement side effects occur.   There are no diagnoses linked to this encounter.  Please see After Visit Summary for patient specific instructions.  No future appointments.  No orders of the defined types were placed in this encounter.     -------------------------------

## 2019-05-24 ENCOUNTER — Encounter: Payer: Self-pay | Admitting: Adult Health

## 2019-05-24 ENCOUNTER — Telehealth (INDEPENDENT_AMBULATORY_CARE_PROVIDER_SITE_OTHER): Payer: PRIVATE HEALTH INSURANCE | Admitting: Adult Health

## 2019-05-24 DIAGNOSIS — F410 Panic disorder [episodic paroxysmal anxiety] without agoraphobia: Secondary | ICD-10-CM

## 2019-05-24 DIAGNOSIS — F411 Generalized anxiety disorder: Secondary | ICD-10-CM

## 2019-05-24 DIAGNOSIS — G4700 Insomnia, unspecified: Secondary | ICD-10-CM

## 2019-05-24 DIAGNOSIS — F4310 Post-traumatic stress disorder, unspecified: Secondary | ICD-10-CM | POA: Diagnosis not present

## 2019-05-24 DIAGNOSIS — F909 Attention-deficit hyperactivity disorder, unspecified type: Secondary | ICD-10-CM | POA: Diagnosis not present

## 2019-05-24 DIAGNOSIS — F331 Major depressive disorder, recurrent, moderate: Secondary | ICD-10-CM | POA: Diagnosis not present

## 2019-05-24 NOTE — Progress Notes (Signed)
JALEEAH Kelly [id number] 1965/04/26 54 y.o.  Virtual Visit via Telephone Note  I connected with pt on 05/24/19 at  5:00 PM EDT by telephone and verified that I am speaking with the correct person using two identifiers.   I discussed the limitations, risks, security and privacy concerns of performing an evaluation and management service by telephone and the availability of in person appointments. I also discussed with the patient that there may be a patient responsible charge related to this service. The patient expressed understanding and agreed to proceed.   I discussed the assessment and treatment plan with the patient. The patient was provided an opportunity to ask questions and all were answered. The patient agreed with the plan and demonstrated an understanding of the instructions.   The patient was advised to call back or seek an in-person evaluation if the symptoms worsen or if the condition fails to improve as anticipated.  I provided 30 minutes of non-face-to-face time during this encounter.  The patient was located at home.  The provider was located at [facility].   [name], NP   Subjective:   Patient ID:  Dana Kelly is a 54 y.o. (DOB 1965-05-03) female.  Chief Complaint: No chief complaint on file.   HPI PAULEEN GOLEMAN presents for follow-up of MDD, GAD, insomnia, panic attacks, ADHD, and PTSD.    Describes mood today as "not good". Pleasant. Tearful throughout interview. Mood symptoms - reports  depression, anxiety, and irritability. Stating "I'm having an off day". Has had "several" off days lately. Was restarted on BP medication several months ago for    increased blood pressure. Her PCP stopped Adderall. Her BP is now regulated and even "low". Concerned about being off of Adderall. Stating "if I don't get back on the Adderall, I will lose my job". Can't get things done. Boss told her to think about whether she can do her job or not  anymore". Stating "my nerves are tore up and I feel so stressed". Feels tired all the time. Goes home - "eats a little". Then going to bed. Decreased interest and motivation. Taking medications as prescribed.  Energy levels low. Active, does not have a regular exercise routine. 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 Single. Lives alone.  Appetite adequate. Weight stable. Sleeps better some nights than others. Averages 5 to 6 hours broken sleep. Focus and concentration difficulties. Completing tasks. Managing aspects of household. Work going well.  Denies SI or HI. Denies AH or VH.   Review of Systems:  Review of Systems  Musculoskeletal: Negative for gait problem.  Neurological: Negative for tremors.  Psychiatric/Behavioral:       Please refer to HPI    Medications: I have reviewed the patient's current medications.  Current Outpatient Medications  Medication Sig Dispense Refill  . amoxicillin-clavulanate (AUGMENTIN) 500-125 MG tablet amoxicillin 500 mg-potassium clavulanate 125 mg tablet    . amphetamine-dextroamphetamine (ADDERALL) 30 MG tablet Take 1 tablet by mouth daily. 30 tablet 0  . amphetamine-dextroamphetamine (ADDERALL) 30 MG tablet Take 1 tablet by mouth daily. 30 tablet 0  . amphetamine-dextroamphetamine (ADDERALL) 30 MG tablet Take 1 tablet by mouth daily. 30 tablet 0  . apixaban (ELIQUIS) 5 MG TABS tablet Eliquis 5 mg tablet  1 tab BID    . clonazePAM (KLONOPIN) 2 MG tablet Take 1 tablet (2 mg total) by mouth 3 (three) times daily. 90 tablet 2  . FLUoxetine (PROZAC) 20 MG capsule  Take 3 capsules daily. 90 capsule 2  . furosemide (LASIX) 40 MG tablet furosemide 40 mg tablet  TAKE ONE TABLET BY MOUTH EVERY OTHER DAY AS DIRECTED    . lisinopril (ZESTRIL) 40 MG tablet lisinopril 40 mg tablet    . ondansetron (ZOFRAN-ODT) 4 MG disintegrating tablet ondansetron 4 mg disintegrating tablet    . prazosin (MINIPRESS) 2 MG capsule  prazosin 2 mg capsule    . prochlorperazine (COMPAZINE) 10 MG tablet prochlorperazine maleate 10 mg tablet    . promethazine (PHENERGAN) 25 MG tablet promethazine 25 mg tablet    . SUMAtriptan (IMITREX) 100 MG tablet Take by mouth.    . traZODone (DESYREL) 100 MG tablet Take two to three tablets at bedtime. 90 tablet 2   No current facility-administered medications for this visit.    Medication Side Effects: None  Allergies:  Allergies  Allergen Reactions  . Dicyclomine Hives  . Peanut-Containing Drug Products Anaphylaxis    Other reaction(s): Anaphylaxis  . Hydromorphone Hcl Other (See Comments)  . Phenytoin Sodium Extended     Decreases blood pressure  . Pregabalin Other (See Comments)    Other reaction(s): Other (See Comments) Amnesiac episodes   . Sulfa Antibiotics   . Hydromorphone Itching    Other reaction(s): Itching  . Meloxicam Nausea And Vomiting    No past medical history on file.  No family history on file.  Social History   Socioeconomic History  . Marital status: Divorced    Spouse name: Not on file  . Number of children: Not on file  . Years of education: Not on file  . Highest education level: Not on file  Occupational History  . Not on file  Tobacco Use  . Smoking status: Never Smoker  . Smokeless tobacco: Never Used  Substance and Sexual Activity  . Alcohol use: Not on file  . Drug use: Not on file  . Sexual activity: Not on file  Other Topics Concern  . Not on file  Social History Narrative  . Not on file   Social Determinants of Health   Financial Resource Strain:   . Difficulty of Paying Living Expenses:   Food Insecurity:   . Worried About Charity fundraiser in the Last Year:   . Arboriculturist in the Last Year:   Transportation Needs:   . Film/video editor (Medical):   Marland Kitchen Lack of Transportation (Non-Medical):   Physical Activity:   . Days of Exercise per Week:   . Minutes of Exercise per Session:   Stress:   . Feeling  of Stress :   Social Connections:   . Frequency of Communication with Friends and Family:   . Frequency of Social Gatherings with Friends and Family:   . Attends Religious Services:   . Active Member of Clubs or Organizations:   . Attends Archivist Meetings:   Marland Kitchen Marital Status:   Intimate Partner Violence:   . Fear of Current or Ex-Partner:   . Emotionally Abused:   Marland Kitchen Physically Abused:   . Sexually Abused:     Past Medical History, Surgical history, Social history, and Family history were reviewed and updated as appropriate.   Please see review of systems for further details on the patient's review from today.   Objective:   Physical Exam:  There were no vitals taken for this visit.  Physical Exam Neurological:     Mental Status: She is alert and oriented to person, place, and time.  Cranial Nerves: No dysarthria.  Psychiatric:        Attention and Perception: Attention and perception normal.        Mood and Affect: Mood normal.        Speech: Speech normal.        Behavior: Behavior is cooperative.        Thought Content: Thought content normal. Thought content is not paranoid or delusional. Thought content does not include homicidal or suicidal ideation. Thought content does not include homicidal or suicidal plan.        Cognition and Memory: Cognition and memory normal.        Judgment: Judgment normal.     Comments: Insight intact     Lab Review:  No results found for: NA, K, CL, CO2, GLUCOSE, BUN, CREATININE, CALCIUM, PROT, ALBUMIN, AST, ALT, ALKPHOS, BILITOT, GFRNONAA, GFRAA  No results found for: WBC, RBC, HGB, HCT, PLT, MCV, MCH, MCHC, RDW, LYMPHSABS, MONOABS, EOSABS, BASOSABS  No results found for: POCLITH, LITHIUM   No results found for: PHENYTOIN, PHENOBARB, VALPROATE, CBMZ   .res Assessment: Plan:    Plan:  Clonazepam 2mg  TID  Prozac 60mg  D'C'd by PCP - Adderall 30mg  daily Trazdone 100mg  at hs - take one to 3 tablets.  Will plan to  call PCP tomorrow to discuss issues and see if she can restart the Adderall. Will call back after speaking with PCP.   RTC 4 weeks  Patient advised to contact office with any questions, adverse effects, or acute worsening in signs and symptoms.  Discussed potential benefits, risk, and side effects of benzodiazepines to include potential risk of tolerance and dependence, as well as possible drowsiness.  Advised patient not to drive if experiencing drowsiness and to take lowest possible effective dose to minimize risk of dependence and tolerance.  Discussed potential metabolic side effects associated with atypical antipsychotics, as well as potential risk for movement side effects. Advised pt to contact office if movement side effects occur.    There are no diagnoses linked to this encounter.  Please see After Visit Summary for patient specific instructions.  No future appointments.  No orders of the defined types were placed in this encounter.     -------------------------------

## 2019-05-25 ENCOUNTER — Inpatient Hospital Stay (HOSPITAL_COMMUNITY)
Admission: AD | Admit: 2019-05-25 | Discharge: 2019-05-28 | DRG: 418 | Disposition: A | Discharge status: Home / Self Care | Payer: PRIVATE HEALTH INSURANCE | Source: Other Acute Inpatient Hospital | Attending: Internal Medicine | Admitting: Internal Medicine

## 2019-05-25 DIAGNOSIS — Z86718 Personal history of other venous thrombosis and embolism: Secondary | ICD-10-CM

## 2019-05-25 DIAGNOSIS — E875 Hyperkalemia: Secondary | ICD-10-CM | POA: Diagnosis present

## 2019-05-25 DIAGNOSIS — K8061 Calculus of gallbladder and bile duct with cholecystitis, unspecified, with obstruction: Principal | ICD-10-CM | POA: Diagnosis present

## 2019-05-25 DIAGNOSIS — F418 Other specified anxiety disorders: Secondary | ICD-10-CM | POA: Diagnosis present

## 2019-05-25 DIAGNOSIS — E785 Hyperlipidemia, unspecified: Secondary | ICD-10-CM | POA: Diagnosis present

## 2019-05-25 DIAGNOSIS — F4310 Post-traumatic stress disorder, unspecified: Secondary | ICD-10-CM | POA: Diagnosis present

## 2019-05-25 DIAGNOSIS — Z91010 Allergy to peanuts: Secondary | ICD-10-CM | POA: Diagnosis not present

## 2019-05-25 DIAGNOSIS — F419 Anxiety disorder, unspecified: Secondary | ICD-10-CM | POA: Diagnosis present

## 2019-05-25 DIAGNOSIS — Z7901 Long term (current) use of anticoagulants: Secondary | ICD-10-CM | POA: Diagnosis not present

## 2019-05-25 DIAGNOSIS — Z20822 Contact with and (suspected) exposure to covid-19: Secondary | ICD-10-CM | POA: Diagnosis present

## 2019-05-25 DIAGNOSIS — K8050 Calculus of bile duct without cholangitis or cholecystitis without obstruction: Secondary | ICD-10-CM | POA: Diagnosis present

## 2019-05-25 DIAGNOSIS — Z882 Allergy status to sulfonamides status: Secondary | ICD-10-CM

## 2019-05-25 DIAGNOSIS — Z888 Allergy status to other drugs, medicaments and biological substances status: Secondary | ICD-10-CM | POA: Diagnosis not present

## 2019-05-25 DIAGNOSIS — F329 Major depressive disorder, single episode, unspecified: Secondary | ICD-10-CM | POA: Diagnosis present

## 2019-05-25 DIAGNOSIS — I10 Essential (primary) hypertension: Secondary | ICD-10-CM | POA: Diagnosis present

## 2019-05-25 DIAGNOSIS — R1011 Right upper quadrant pain: Secondary | ICD-10-CM

## 2019-05-25 DIAGNOSIS — R748 Abnormal levels of other serum enzymes: Secondary | ICD-10-CM | POA: Diagnosis not present

## 2019-05-25 DIAGNOSIS — Z79899 Other long term (current) drug therapy: Secondary | ICD-10-CM | POA: Diagnosis not present

## 2019-05-25 DIAGNOSIS — G40909 Epilepsy, unspecified, not intractable, without status epilepticus: Secondary | ICD-10-CM

## 2019-05-25 DIAGNOSIS — Z86711 Personal history of pulmonary embolism: Secondary | ICD-10-CM | POA: Diagnosis not present

## 2019-05-25 DIAGNOSIS — G43909 Migraine, unspecified, not intractable, without status migrainosus: Secondary | ICD-10-CM | POA: Diagnosis present

## 2019-05-25 DIAGNOSIS — Z6841 Body Mass Index (BMI) 40.0 and over, adult: Secondary | ICD-10-CM

## 2019-05-25 DIAGNOSIS — D539 Nutritional anemia, unspecified: Secondary | ICD-10-CM | POA: Diagnosis present

## 2019-05-25 HISTORY — DX: Essential (primary) hypertension: I10

## 2019-05-25 HISTORY — DX: Personal history of Methicillin resistant Staphylococcus aureus infection: Z86.14

## 2019-05-25 HISTORY — DX: Anxiety disorder, unspecified: F41.9

## 2019-05-25 LAB — HIV ANTIBODY (ROUTINE TESTING W REFLEX): HIV Screen 4th Generation wRfx: NONREACTIVE

## 2019-05-25 MED ORDER — ONDANSETRON HCL 4 MG PO TABS
4.0000 mg | ORAL_TABLET | Freq: Four times a day (QID) | ORAL | Status: DC | PRN
  Administered 2019-05-27: 4 mg via ORAL
  Filled 2019-05-25: qty 1

## 2019-05-25 MED ORDER — SODIUM CHLORIDE 0.9 % IV SOLN
500.0000 mg | Freq: Three times a day (TID) | INTRAVENOUS | Status: DC
  Administered 2019-05-26: 500 mg via INTRAVENOUS
  Filled 2019-05-25 (×3): qty 0.5

## 2019-05-25 MED ORDER — DEXTROSE IN LACTATED RINGERS 5 % IV SOLN: INTRAVENOUS | Status: DC

## 2019-05-25 MED ORDER — SODIUM CHLORIDE 0.9 % IV SOLN
500.0000 mg | Freq: Once | INTRAVENOUS | Status: AC
  Administered 2019-05-25: 500 mg via INTRAVENOUS
  Filled 2019-05-25: qty 0.5

## 2019-05-25 MED ORDER — MORPHINE SULFATE (PF) 2 MG/ML IV SOLN
2.0000 mg | INTRAVENOUS | Status: DC | PRN
  Administered 2019-05-25 – 2019-05-27 (×9): 2 mg via INTRAVENOUS
  Filled 2019-05-25 (×9): qty 1

## 2019-05-25 MED ORDER — ONDANSETRON HCL 4 MG/2ML IJ SOLN
4.0000 mg | Freq: Four times a day (QID) | INTRAMUSCULAR | Status: DC | PRN
  Administered 2019-05-26 – 2019-05-27 (×3): 4 mg via INTRAVENOUS
  Filled 2019-05-25: qty 2

## 2019-05-25 NOTE — Progress Notes (Signed)
[facility] admissions called for admission.Awaiting for call.

## 2019-05-25 NOTE — Progress Notes (Signed)
Received call about GI consult re: choledocholithiasis  Will notify [facility] hospital team to see in AM  Keep NPO after MN please

## 2019-05-25 NOTE — H&P (Signed)
History and Physical   Dana Kelly TKZ:[id number] DOB: 1965-06-03 DOA: 05/25/2019  Referring MD/NP/PA: From [facility]  PCP: Marylee Floras, FNP   Outpatient Specialists: None  Patient coming from: [facility]  Chief Complaint: Abdominal pain  HPI: Dana Kelly is a 54 y.o. female with medical history significant of hypertension, hyperlipidemia, seizure disorder, posttraumatic stress disorder, depression with anxiety who was transferred from [facility] secondary to abdominal pain nausea and vomiting.  Her symptoms have been going on for couple of days.  Patient was seen over there and evaluated.  She appears to have choledocholithiasis.  No GI available over there so patient transferred to this hospital for evaluation and GI work-up.  She still having significant abdominal pain rated 7 out of 10 in the epigastric right upper quadrant.  Denied fever or chills denied any hematemesis melena or bright red blood per rectum.  She is however still anorexic.  Patient also has been bloated.  Otherwise she is hemodynamically stable.  She has not taken anything by mouth for 3 days due to the persistent nausea and the vomiting.  Vitals are stable on arrival.  Review of lab work from outside hospital showed evidence of obstructive jaundice..    Review of Systems: As per HPI otherwise 10 point review of systems negative.    No past medical history on file.  No past surgical history on file.   reports that she has never smoked. She has never used smokeless tobacco. No history on file for alcohol and drug.  Allergies  Allergen Reactions  . Dicyclomine Hives  . Peanut-Containing Drug Products Anaphylaxis    Other reaction(s): Anaphylaxis  . Hydromorphone Hcl Other (See Comments)  . Phenytoin Sodium Extended     Decreases blood pressure  . Pregabalin Other (See Comments)    Other reaction(s): Other (See Comments) Amnesiac episodes     . Sulfa Antibiotics   . Hydromorphone Itching    Other reaction(s): Itching  . Meloxicam Nausea And Vomiting    No family history on file.   Prior to Admission medications   Medication Sig Start Date End Date Taking? Authorizing Provider  amoxicillin-clavulanate (AUGMENTIN) 500-125 MG tablet amoxicillin 500 mg-potassium clavulanate 125 mg tablet    [provider]  amphetamine-dextroamphetamine (ADDERALL) 30 MG tablet Take 1 tablet by mouth daily. 03/22/19   Mozingo, Berdie Ogren, NP  amphetamine-dextroamphetamine (ADDERALL) 30 MG tablet Take 1 tablet by mouth daily. 04/19/19   Mozingo, Berdie Ogren, NP  amphetamine-dextroamphetamine (ADDERALL) 30 MG tablet Take 1 tablet by mouth daily. 05/17/19   Mozingo, Berdie Ogren, NP  apixaban (ELIQUIS) 5 MG TABS tablet Eliquis 5 mg tablet  1 tab BID    [provider]  clonazePAM (KLONOPIN) 2 MG tablet Take 1 tablet (2 mg total) by mouth 3 (three) times daily. 03/22/19   Mozingo, Berdie Ogren, NP  FLUoxetine (PROZAC) 20 MG capsule Take 3 capsules daily. 03/22/19   Mozingo, Berdie Ogren, NP  furosemide (LASIX) 40 MG tablet furosemide 40 mg tablet  TAKE ONE TABLET BY MOUTH EVERY OTHER DAY AS DIRECTED    [provider]  lisinopril (ZESTRIL) 40 MG tablet lisinopril 40 mg tablet    [provider]  ondansetron (ZOFRAN-ODT) 4 MG disintegrating tablet ondansetron 4 mg disintegrating tablet    [provider]  prazosin (MINIPRESS) 2 MG capsule prazosin 2 mg capsule    [provider]  prochlorperazine (COMPAZINE) 10 MG tablet prochlorperazine maleate 10 mg  tablet    [provider]  promethazine (PHENERGAN) 25 MG tablet promethazine 25 mg tablet    [provider]  SUMAtriptan (IMITREX) 100 MG tablet Take by mouth.    [provider]  traZODone (DESYREL) 100 MG tablet Take two to three tablets at bedtime. 03/22/19   Mozingo, Thereasa Solo, NP    Physical  Exam: Vitals:   05/25/19 1815 05/25/19 1945 05/25/19 2303  BP: 113/73  111/72  Pulse: 83  75  Resp: 18  14  Temp: 98.5 F (36.9 C)  97.8 F (36.6 C)  TempSrc: Oral  Oral  SpO2: 98%  100%  Weight:  114.3 kg   Height:  5\' 3"  (1.6 m)       Constitutional: Obese, mild distress due to pain Vitals:   05/25/19 1815 05/25/19 1945 05/25/19 2303  BP: 113/73  111/72  Pulse: 83  75  Resp: 18  14  Temp: 98.5 F (36.9 C)  97.8 F (36.6 C)  TempSrc: Oral  Oral  SpO2: 98%  100%  Weight:  114.3 kg   Height:  5\' 3"  (1.6 m)    Eyes: PERRL, lids and conjunctivae normal ENMT: Mucous membranes are moist. Posterior pharynx clear of any exudate or lesions.Normal dentition.  Neck: normal, supple, no masses, no thyromegaly Respiratory: clear to auscultation bilaterally, no wheezing, no crackles. Normal respiratory effort. No accessory muscle use.  Cardiovascular: Regular rate and rhythm, no murmurs / rubs / gallops. No extremity edema. 2+ pedal pulses. No carotid bruits.  Abdomen: Epigastric to right upper quadrant abdominal tenderness, no masses palpated. No hepatosplenomegaly. Bowel sounds positive.  Musculoskeletal: no clubbing / cyanosis. No joint deformity upper and lower extremities. Good ROM, no contractures. Normal muscle tone.  Skin: no rashes, lesions, ulcers. No induration Neurologic: CN 2-12 grossly intact. Sensation intact, DTR normal. Strength 5/5 in all 4.  Psychiatric: Normal judgment and insight. Alert and oriented x 3.  Anxious mood.     Labs on Admission: I have personally reviewed following labs and imaging studies  CBC: No results for input(s): WBC, NEUTROABS, HGB, HCT, MCV, PLT in the last 168 hours. Basic Metabolic Panel: No results for input(s): NA, K, CL, CO2, GLUCOSE, BUN, CREATININE, CALCIUM, MG, PHOS in the last 168 hours. GFR: CrCl cannot be calculated (No successful lab value found.). Liver Function Tests: No results for input(s): AST, ALT, ALKPHOS, BILITOT,  PROT, ALBUMIN in the last 168 hours. No results for input(s): LIPASE, AMYLASE in the last 168 hours. No results for input(s): AMMONIA in the last 168 hours. Coagulation Profile: No results for input(s): INR, PROTIME in the last 168 hours. Cardiac Enzymes: No results for input(s): CKTOTAL, CKMB, CKMBINDEX, TROPONINI in the last 168 hours. BNP (last 3 results) No results for input(s): PROBNP in the last 8760 hours. HbA1C: No results for input(s): HGBA1C in the last 72 hours. CBG: No results for input(s): GLUCAP in the last 168 hours. Lipid Profile: No results for input(s): CHOL, HDL, LDLCALC, TRIG, CHOLHDL, LDLDIRECT in the last 72 hours. Thyroid Function Tests: No results for input(s): TSH, T4TOTAL, FREET4, T3FREE, THYROIDAB in the last 72 hours. Anemia Panel: No results for input(s): VITAMINB12, FOLATE, FERRITIN, TIBC, IRON, RETICCTPCT in the last 72 hours. Urine analysis: No results found for: COLORURINE, APPEARANCEUR, LABSPEC, PHURINE, GLUCOSEU, HGBUR, BILIRUBINUR, KETONESUR, PROTEINUR, UROBILINOGEN, NITRITE, LEUKOCYTESUR Sepsis Labs: @LABRCNTIP (procalcitonin:4,lacticidven:4) )No results found for this or any previous visit (from the past 240 hour(s)).   Radiological Exams on Admission: No results found.  Assessment/Plan Principal Problem:   Choledocholithiasis Active Problems:   Anxiety   Essential hypertension   Hyperlipidemia   Seizure disorder (HCC)     #1 choledocholithiasis: Patient will be admitted and kept n.p.o.  [facility] consulted and patient will be seen in the morning for possible ERCP.  Will defer possibility of MRCP instead.  Patient is otherwise hemodynamically stable.  Pain control as well as nausea with vomiting control.  #2 essential hypertension: Resume on continue blood pressure medications.  #3 hyperlipidemia: Will hold medications as patient will be n.p.o.  #5 seizure disorder: IV medications may be used once home medications are  verified.  #6 posttraumatic stress disorder: Counseling being provided.  #7 morbid obesity: Patient will be NPO.  Further counseling.   DVT prophylaxis: Lovenox Code Status: Full code Family Communication: Mother at bedside Disposition Plan: Home Consults called: Dr. Stan Head, gastroenterology Admission status: Inpatient  Severity of Illness: The appropriate patient status for this patient is INPATIENT. Inpatient status is judged to be reasonable and necessary in order to provide the required intensity of service to ensure the patient's safety. The patient's presenting symptoms, physical exam findings, and initial radiographic and laboratory data in the context of their chronic comorbidities is felt to place them at high risk for further clinical deterioration. Furthermore, it is not anticipated that the patient will be medically stable for discharge from the hospital within 2 midnights of admission. The following factors support the patient status of inpatient.   " The patient's presenting symptoms include abdominal pain nausea vomiting. " The worrisome physical exam findings include epigastric tenderness. " The initial radiographic and laboratory data are worrisome because of evidence of choledocholithiasis from outside hospital. " The chronic co-morbidities include hypertension hyperlipidemia.   * I certify that at the point of admission it is my clinical judgment that the patient will require inpatient hospital care spanning beyond 2 midnights from the point of admission due to high intensity of service, high risk for further deterioration and high frequency of surveillance required.Lonia Blood MD [facility] Pager [phone]  If 7PM-7AM, please contact night-coverage [email or web address] Password [facility]  05/26/2019, 12:23 AM

## 2019-05-25 NOTE — Progress Notes (Signed)
[facility] transmitted COVID test result done on 05/19/2; 2314 thru facsimile and it was negative.  Test result placed on patient's chart

## 2019-05-26 ENCOUNTER — Inpatient Hospital Stay (HOSPITAL_COMMUNITY): Payer: PRIVATE HEALTH INSURANCE

## 2019-05-26 ENCOUNTER — Inpatient Hospital Stay (HOSPITAL_COMMUNITY): Payer: PRIVATE HEALTH INSURANCE | Admitting: Certified Registered"

## 2019-05-26 ENCOUNTER — Encounter (HOSPITAL_COMMUNITY)
Admission: AD | Disposition: A | Discharge status: Home / Self Care | Payer: Self-pay | Source: Other Acute Inpatient Hospital | Attending: Family Medicine

## 2019-05-26 ENCOUNTER — Encounter (HOSPITAL_COMMUNITY): Payer: Self-pay | Admitting: Internal Medicine

## 2019-05-26 ENCOUNTER — Other Ambulatory Visit: Payer: Self-pay

## 2019-05-26 DIAGNOSIS — I10 Essential (primary) hypertension: Secondary | ICD-10-CM

## 2019-05-26 DIAGNOSIS — F419 Anxiety disorder, unspecified: Secondary | ICD-10-CM

## 2019-05-26 DIAGNOSIS — K8050 Calculus of bile duct without cholangitis or cholecystitis without obstruction: Secondary | ICD-10-CM

## 2019-05-26 HISTORY — PX: ERCP: SHX5425

## 2019-05-26 HISTORY — PX: SPHINCTEROTOMY: SHX5544

## 2019-05-26 LAB — CBC
HCT: 33 % — ABNORMAL LOW (ref 36.0–46.0)
Hemoglobin: 10.5 g/dL — ABNORMAL LOW (ref 12.0–15.0)
MCH: 32.3 pg (ref 26.0–34.0)
MCHC: 31.8 g/dL (ref 30.0–36.0)
MCV: 101.5 fL — ABNORMAL HIGH (ref 80.0–100.0)
Platelets: 298 10*3/uL (ref 150–400)
RBC: 3.25 MIL/uL — ABNORMAL LOW (ref 3.87–5.11)
RDW: 12.6 % (ref 11.5–15.5)
WBC: 4.7 10*3/uL (ref 4.0–10.5)
nRBC: 0 % (ref 0.0–0.2)

## 2019-05-26 LAB — COMPREHENSIVE METABOLIC PANEL WITH GFR
ALT: 25 U/L (ref 0–44)
AST: 26 U/L (ref 15–41)
Albumin: 3 g/dL — ABNORMAL LOW (ref 3.5–5.0)
Alkaline Phosphatase: 37 U/L — ABNORMAL LOW (ref 38–126)
Anion gap: 7 (ref 5–15)
BUN: 7 mg/dL (ref 6–20)
CO2: 23 mmol/L (ref 22–32)
Calcium: 8.7 mg/dL — ABNORMAL LOW (ref 8.9–10.3)
Chloride: 109 mmol/L (ref 98–111)
Creatinine, Ser: 1.01 mg/dL — ABNORMAL HIGH (ref 0.44–1.00)
GFR calc Af Amer: >60 mL/min
GFR calc non Af Amer: >60 mL/min
Glucose, Bld: 132 mg/dL — ABNORMAL HIGH (ref 70–99)
Potassium: 4.1 mmol/L (ref 3.5–5.1)
Sodium: 139 mmol/L (ref 135–145)
Total Bilirubin: 0.7 mg/dL (ref 0.3–1.2)
Total Protein: 5.4 g/dL — ABNORMAL LOW (ref 6.5–8.1)

## 2019-05-26 LAB — GLUCOSE, CAPILLARY
Glucose-Capillary: 112 mg/dL — ABNORMAL HIGH (ref 70–99)
Glucose-Capillary: 119 mg/dL — ABNORMAL HIGH (ref 70–99)
Glucose-Capillary: 147 mg/dL — ABNORMAL HIGH (ref 70–99)

## 2019-05-26 SURGERY — ERCP, WITH INTERVENTION IF INDICATED
Anesthesia: General

## 2019-05-26 MED ORDER — SODIUM CHLORIDE 0.9 % IV SOLN
1.0000 g | Freq: Three times a day (TID) | INTRAVENOUS | Status: DC
  Administered 2019-05-26: 1 g via INTRAVENOUS
  Filled 2019-05-26 (×4): qty 1

## 2019-05-26 MED ORDER — FENTANYL CITRATE (PF) 100 MCG/2ML IJ SOLN
INTRAMUSCULAR | Status: DC | PRN
  Administered 2019-05-26: 50 ug via INTRAVENOUS

## 2019-05-26 MED ORDER — INDOMETHACIN 50 MG RE SUPP
RECTAL | Status: AC
  Filled 2019-05-26: qty 2

## 2019-05-26 MED ORDER — PROPOFOL 10 MG/ML IV BOLUS
INTRAVENOUS | Status: DC | PRN
  Administered 2019-05-26: 150 mg via INTRAVENOUS

## 2019-05-26 MED ORDER — PHENOL 1.4 % MT LIQD
1.0000 | OROMUCOSAL | Status: DC | PRN
  Administered 2019-05-26: 1 via OROMUCOSAL
  Filled 2019-05-26: qty 177

## 2019-05-26 MED ORDER — DEXAMETHASONE SODIUM PHOSPHATE 10 MG/ML IJ SOLN
INTRAMUSCULAR | Status: DC | PRN
  Administered 2019-05-26: 10 mg via INTRAVENOUS

## 2019-05-26 MED ORDER — CLONAZEPAM 1 MG PO TABS
2.0000 mg | ORAL_TABLET | Freq: Once | ORAL | Status: AC
  Administered 2019-05-26: 2 mg via ORAL
  Filled 2019-05-26: qty 2

## 2019-05-26 MED ORDER — MIDAZOLAM HCL 2 MG/2ML IJ SOLN
INTRAMUSCULAR | Status: DC | PRN
  Administered 2019-05-26: 2 mg via INTRAVENOUS

## 2019-05-26 MED ORDER — ONDANSETRON HCL 4 MG/2ML IJ SOLN
INTRAMUSCULAR | Status: AC
  Filled 2019-05-26: qty 2

## 2019-05-26 MED ORDER — ROCURONIUM BROMIDE 10 MG/ML (PF) SYRINGE
PREFILLED_SYRINGE | INTRAVENOUS | Status: DC | PRN
  Administered 2019-05-26: 60 mg via INTRAVENOUS

## 2019-05-26 MED ORDER — TRAMADOL HCL 50 MG PO TABS: 300.0000 mg | ORAL_TABLET | Freq: Once | ORAL | Status: DC

## 2019-05-26 MED ORDER — LIDOCAINE 2% (20 MG/ML) 5 ML SYRINGE
INTRAMUSCULAR | Status: DC | PRN
  Administered 2019-05-26: 80 mg via INTRAVENOUS

## 2019-05-26 MED ORDER — PROMETHAZINE HCL 25 MG/ML IJ SOLN: 6.2500 mg | INTRAMUSCULAR | Status: DC | PRN

## 2019-05-26 MED ORDER — INDOMETHACIN 50 MG RE SUPP
100.0000 mg | Freq: Once | RECTAL | Status: AC
  Administered 2019-05-26: 100 mg via RECTAL
  Filled 2019-05-26 (×2): qty 2

## 2019-05-26 MED ORDER — SUGAMMADEX SODIUM 200 MG/2ML IV SOLN
INTRAVENOUS | Status: DC | PRN
  Administered 2019-05-26: 200 mg via INTRAVENOUS

## 2019-05-26 MED ORDER — ONDANSETRON HCL 4 MG/2ML IJ SOLN
INTRAMUSCULAR | Status: DC | PRN
  Administered 2019-05-26: 4 mg via INTRAVENOUS

## 2019-05-26 MED ORDER — LACTATED RINGERS IV SOLN: INTRAVENOUS | Status: DC | PRN

## 2019-05-26 MED ORDER — FENTANYL CITRATE (PF) 100 MCG/2ML IJ SOLN: 25.0000 ug | INTRAMUSCULAR | Status: DC | PRN

## 2019-05-26 MED ORDER — TRAZODONE HCL 150 MG PO TABS
300.0000 mg | ORAL_TABLET | Freq: Once | ORAL | Status: AC
  Administered 2019-05-26: 300 mg via ORAL
  Filled 2019-05-26: qty 2

## 2019-05-26 MED ORDER — GLUCAGON HCL RDNA (DIAGNOSTIC) 1 MG IJ SOLR
INTRAMUSCULAR | Status: DC | PRN
  Administered 2019-05-26: .5 mg via INTRAVENOUS

## 2019-05-26 NOTE — Progress Notes (Signed)
   05/26/19 1211  Assess: MEWS Score  Temp 98.4 F (36.9 C)  BP (!) 96/51  Pulse Rate 75  Resp 16  SpO2 100 %  Assess: MEWS Score  MEWS Temp 0  MEWS Systolic 1  MEWS Pulse 0  MEWS RR 1  MEWS LOC 0  MEWS Score 2  MEWS Score Color Yellow  Assess: if the MEWS score is Yellow or Red  Were vital signs taken at a resting state? Yes  Focused Assessment Documented focused assessment  Early Detection of Sepsis Score *See Row Information* Low  MEWS guidelines implemented *See Row Information* Yes  Treat  MEWS Interventions Escalated (See documentation below)  Take Vital Signs  Increase Vital Sign Frequency  Yellow: Q 2hr X 2 then Q 4hr X 2, if remains yellow, continue Q 4hrs  Escalate  MEWS: Escalate Yellow: discuss with charge nurse/RN and consider discussing with provider and RRT  Notify: Charge Nurse/RN  Name of Charge Nurse/RN Notified Alona Bene, RN   Date Charge Nurse/RN Notified 05/26/19  Time Charge Nurse/RN Notified 1320  Notify: Provider  Provider Name/Title Hazeline Junker  Date Provider Notified 05/26/19  Time Provider Notified 1410  Notification Type Page  Notification Reason Other (Comment) (BP low 90s)  Response No new orders  Document  Patient Outcome Stabilized after interventions  Progress note created (see row info) Yes

## 2019-05-26 NOTE — Anesthesia Preprocedure Evaluation (Signed)
Anesthesia Evaluation  Patient identified by MRN, date of birth, ID band Patient awake    Reviewed: Allergy & Precautions, NPO status , Patient's Chart, lab work & pertinent test results  Airway Mallampati: III  TM Distance: >3 FB Neck ROM: Full    Dental  (+) Teeth Intact, Dental Advisory Given   Pulmonary PE   Pulmonary exam normal breath sounds clear to auscultation       Cardiovascular hypertension, Pt. on medications Normal cardiovascular exam Rhythm:Regular Rate:Normal     Neuro/Psych  Headaches, Seizures -,  PSYCHIATRIC DISORDERS Anxiety Depression    GI/Hepatic negative GI ROS, Choledocholithiasis   Endo/Other  Morbid obesity  Renal/GU negative Renal ROS     Musculoskeletal negative musculoskeletal ROS (+)   Abdominal   Peds  Hematology  (+) Blood dyscrasia (Eliquis), anemia ,   Anesthesia Other Findings Day of surgery medications reviewed with the patient.  Reproductive/Obstetrics                             Anesthesia Physical Anesthesia Plan  ASA: III  Anesthesia Plan: General   Post-op Pain Management:    Induction: Intravenous  PONV Risk Score and Plan: 3 and Midazolam, Dexamethasone and Ondansetron  Airway Management Planned: Oral ETT  Additional Equipment:   Intra-op Plan:   Post-operative Plan: Extubation in OR  Informed Consent: I have reviewed the patients History and Physical, chart, labs and discussed the procedure including the risks, benefits and alternatives for the proposed anesthesia with the patient or authorized representative who has indicated his/her understanding and acceptance.     Dental advisory given  Plan Discussed with: CRNA  Anesthesia Plan Comments:         Anesthesia Quick Evaluation

## 2019-05-26 NOTE — Interval H&P Note (Signed)
History and Physical Interval Note:  05/26/2019 3:15 PM  Dana Kelly  has presented today for surgery, with the diagnosis of choledocholithiasis.  The various methods of treatment have been discussed with the patient and family. After consideration of risks, benefits and other options for treatment, the patient has consented to  Procedure(s): ENDOSCOPIC RETROGRADE CHOLANGIOPANCREATOGRAPHY (ERCP) (N/A) as a surgical intervention.  The patient's history has been reviewed, patient examined, no change in status, stable for surgery.  I have reviewed the patient's chart and labs.  Questions were answered to the patient's satisfaction.     Rachael Fee

## 2019-05-26 NOTE — Op Note (Signed)
[facility] Patient Name: Dana Kelly Procedure Date : 05/26/2019 MRN: [id number] Attending MD: Rachael Fee , MD Date of Birth: 08-16-1965 CSN: [id number] Age: 54 Admit Type: Inpatient Procedure:                ERCP Indications:              Common bile duct stone(s) on [place] CT with                            stones or sludge in GB Providers:                Rachael Fee, MD, Glory Rosebush, RN, Arlee Muslim Tech., Technician Referring MD:              Medicines:                General Anesthesia, Indomethacin 100 mg PR Complications:            No immediate complications. Estimated blood loss:                            None Estimated Blood Loss:     Estimated blood loss: none. Procedure:                Pre-Anesthesia Assessment:                           - Prior to the procedure, a History and Physical                            was performed, and patient medications and                            allergies were reviewed. The patient's tolerance of                            previous anesthesia was also reviewed. The risks                            and benefits of the procedure and the sedation                            options and risks were discussed with the patient.                            All questions were answered, and informed consent                            was obtained. Prior Anticoagulants: The patient has                            taken no previous anticoagulant or antiplatelet  agents. ASA Grade Assessment: III - A patient with                            severe systemic disease. After reviewing the risks                            and benefits, the patient was deemed in                            satisfactory condition to undergo the procedure.                           After obtaining informed consent, the scope was                            passed under direct vision.  Throughout the                            procedure, the patient's blood pressure, pulse, and                            oxygen saturations were monitored continuously. The                            [id number]-Q180V ([id number] Olympus Duodensocope was                            introduced through the mouth, and used to inject                            contrast into and used to cannulate the bile duct.                            The ERCP was accomplished without difficulty. The                            patient tolerated the procedure well. Scope In: Scope Out: Findings:      The scout film was normal. The esophagus was successfully intubated       under direct vision. The scope was advanced to a normal major papilla in       the descending duodenum without detailed examination of the pharynx,       larynx and associated structures, and upper GI tract. The upper GI tract       was grossly normal. The major papilla was somewhat bulbous. I used a 44       Autotome over a .035 hydrawire to rather easily cannulate the bile duct       and contrast was injected. Cholangiogram revealed diffusely dilated       extrahepatic bile duct (CBD 8-17mm) that tapered smoothly into the head       of the pancreas. The cystic duct and gallbladder both partially       opacified. There were no obvious filling defects however given the CT       report and non drainage of the dye I  performed a small biliary       sphincterotomy and then swept the bile duct several times with a 9-30mm       biliary balloon. No purulence or stones was delivered into the duodenum.       The pancreatic duct was cannulated with the wire but never injected with       dye. Impression:               - Dilated CBD, without obvious filling defects                            however given the CT report and non drainage of the                            dye I performed a small biliary sphincterotomy and                            then swept the  bile duct several times. No stones                            or purulence.                           - The cystic duct and gallbladder opacified with                            dye and so clearly no acute cholecystitis Recommendation:           - Observe, advance diet.                           - I am going to stop her antibiotics given findings                            today.                           - Hospitalist to consult surgery to consider GB                            surgery. Procedure Code(s):        --- Professional ---                           [phone], Endoscopic retrograde                            cholangiopancreatography (ERCP); with                            sphincterotomy/papillotomy Diagnosis Code(s):        --- Professional ---                           K80.50, Calculus of bile duct without cholangitis  or cholecystitis without obstruction                           K83.8, Other specified diseases of biliary tract CPT copyright 2019 American Medical Association. All rights reserved. The codes documented in this report are preliminary and upon coder review may  be revised to meet current compliance requirements. Rachael Fee, MD 05/26/2019 5:14:24 PM This report has been signed electronically. Number of Addenda: 0

## 2019-05-26 NOTE — H&P (View-Only) (Signed)
Referring Provider:  [facility]         Primary Care Physician:  Marylee Floras, FNP Primary Gastroenterologist:   Althia Forts           We were asked to see this patient for:    choledocholithiasis              ASSESSMENT /  PLAN    54 yo female with PMH significant for hyperlipidemia, migraines, partial hysterectomy, appendectomy, ADHD, depression, anxiety, ? PE on Eliquis  # Abdominal pain / choledocholithiasis / cholelithiasisis --LFTs basically normal yesterday in [place] and today but CT scan w/ contrast in [place] suggesting distal CBD stone.  --Plan is for ERCP with stone extraction this afternoon.  The  Benefits and risks of ERCP including infection, perforation, bleeding and pancreatitis were explained to the patient and she agrees to proceed.  --Will need cholecystectomy at some point to prevent a reoccurrence.  # History of PE ( per patient) --On Eliquis at home but none since Tuesday  # Macrocytic anemia.  --Hgb is 10.5, possibly volume related.  --Can further evaluate as outpatient.  --No overt GI bleeding  HPI:    Chief Complaint: abdominal pain  Dana Kelly is a 54 y.o. female transferred from [facility] yesterday. She had presented there with vomiting and RUQ pain. Tbili 0.5, AST 40, ALT 37, alk phos 65, WBC was 7, hgb 12.6, INR 1.5, SARS negative. CT scan w/ contrast showed normal liver, intra/ extrahepatic ductal dilation. CBD 8 mm. A roughly  7 mm rounding high attenuated focus within distal CBD possible representing a stone,  either sludge or stones in gallbladder, moderate colonic stool burden. Due to limited endoscopic ability GI requested she be transferred to [facility].  Patient says her epigastric pain started on Tuesday. Initially it was intermittent then by Wed it had become constant and associated with nausea and vomiting She hasn't been unable to eat anything since Tuesday due to the nausea / pain.  Pain was She had similar pain  in January when hospitalized with PNA and sepsis but doesn't know of any associated gallbladder problems. She has no chronic GI issues, has never seen a GI. No pertinent [place].    PMH:   PNA, ? PE, migraines, hyperlipidemia  PSH: partial hysterectomy, appendectomy  [place]: No colon cancer, no liver disease.   Prior to Admission medications   Medication Sig Start Date End Date Taking? Authorizing Provider  amphetamine-dextroamphetamine (ADDERALL) 30 MG tablet Take 1 tablet by mouth daily. 05/17/19  Yes Mozingo, Berdie Ogren, NP  apixaban (ELIQUIS) 5 MG TABS tablet Take 5 mg by mouth 2 (two) times daily.    Yes [provider]  atorvastatin (LIPITOR) 80 MG tablet Take 80 mg by mouth at bedtime. 04/06/19  Yes [provider]  clonazePAM (KLONOPIN) 2 MG tablet Take 1 tablet (2 mg total) by mouth 3 (three) times daily. 03/22/19  Yes Mozingo, Berdie Ogren, NP  ergotamine-caffeine (CAFERGOT) 1-100 MG tablet Take 2 tablets by mouth as needed for migraine. Take if headache is caught early enough and won't need the sumatriptan shot. Do not exceed 3 doses in 24 hours. 04/06/19  Yes [provider]  FLUoxetine (PROZAC) 20 MG capsule Take 3 capsules daily. Patient taking differently: Take 60 mg by mouth daily.  03/22/19  Yes Mozingo, Berdie Ogren, NP  furosemide (LASIX) 40 MG tablet Take 40 mg by mouth every other day.    Yes [provider]  lisinopril (ZESTRIL)  40 MG tablet Take 40 mg by mouth daily.    Yes [provider]  ondansetron (ZOFRAN-ODT) 4 MG disintegrating tablet Take 4 mg by mouth every 8 (eight) hours as needed for nausea or vomiting.    Yes [provider]  prazosin (MINIPRESS) 2 MG capsule Take 2 mg by mouth at bedtime as needed (nightmares).    Yes [provider]  prochlorperazine (COMPAZINE) 10 MG tablet Take 10 mg by mouth as needed for nausea or vomiting.    Yes [provider]  promethazine (PHENERGAN) 25 MG tablet  Take 25 mg by mouth every 6 (six) hours as needed for nausea or vomiting.    Yes [provider]  SUMAtriptan 6 MG/0.5ML SOAJ Inject 6 mg into the skin as needed (migraine).  05/04/19  Yes [provider]  traZODone (DESYREL) 100 MG tablet Take two to three tablets at bedtime. Patient taking differently: Take 300 mg by mouth at bedtime.  03/22/19  Yes Mozingo, Berdie Ogren, NP  amphetamine-dextroamphetamine (ADDERALL) 30 MG tablet Take 1 tablet by mouth daily. Patient not taking: Reported on 05/25/2019 03/22/19   Mozingo, Berdie Ogren, NP  amphetamine-dextroamphetamine (ADDERALL) 30 MG tablet Take 1 tablet by mouth daily. Patient not taking: Reported on 05/25/2019 04/19/19   Mozingo, Berdie Ogren, NP    Current Facility-Administered Medications  Medication Dose Route Frequency Provider Last Rate Last Admin  . dextrose 5 % in lactated ringers infusion   Intravenous Continuous Elwyn Reach, MD 125 mL/hr at 05/26/19 0345 New Bag at 05/26/19 0345  . meropenem (MERREM) 500 mg in sodium chloride 0.9 % 100 mL IVPB  500 mg Intravenous Q8H Garba, Mohammad L, MD 200 mL/hr at 05/26/19 0508 500 mg at 05/26/19 0508  . morphine 2 MG/ML injection 2 mg  2 mg Intravenous Q2H PRN Elwyn Reach, MD   2 mg at 05/26/19 0850  . ondansetron (ZOFRAN) tablet 4 mg  4 mg Oral Q6H PRN Elwyn Reach, MD       Or  . ondansetron (ZOFRAN) injection 4 mg  4 mg Intravenous Q6H PRN Elwyn Reach, MD        Allergies as of 05/25/2019 - Review Complete 05/25/2019  Allergen Reaction Noted  . Dicyclomine Hives 06/07/2017  . Peanut-containing drug products Anaphylaxis 05/13/2016  . Hydromorphone hcl Other (See Comments) 12/28/2018  . Phenytoin sodium extended  06/07/2017  . Pregabalin Other (See Comments) 10/14/2014  . Sulfa antibiotics  12/28/2018  . Hydromorphone Itching 05/13/2016  . Meloxicam Nausea And Vomiting 06/07/2017    No family history on file.  Social History    Socioeconomic History  . Marital status: Divorced    Spouse name: Not on file  . Number of children: Not on file  . Years of education: Not on file  . Highest education level: Not on file  Occupational History  . Not on file  Tobacco Use  . Smoking status: Never Smoker  . Smokeless tobacco: Never Used  Substance and Sexual Activity  . Alcohol use: Not on file  . Drug use: Not on file  . Sexual activity: Not on file  Other Topics Concern  . Not on file  Social History Narrative  . Not on file   Social Determinants of Health   Financial Resource Strain:   . Difficulty of Paying Living Expenses:   Food Insecurity:   . Worried About Charity fundraiser in the Last Year:   . Arboriculturist in  the Last Year:   Transportation Needs:   . Film/video editor (Medical):   Marland Kitchen Lack of Transportation (Non-Medical):   Physical Activity:   . Days of Exercise per Week:   . Minutes of Exercise per Session:   Stress:   . Feeling of Stress :   Social Connections:   . Frequency of Communication with Friends and Family:   . Frequency of Social Gatherings with Friends and Family:   . Attends Religious Services:   . Active Member of Clubs or Organizations:   . Attends Archivist Meetings:   Marland Kitchen Marital Status:   Intimate Partner Violence:   . Fear of Current or Ex-Partner:   . Emotionally Abused:   Marland Kitchen Physically Abused:   . Sexually Abused:     Review of Systems: All systems reviewed and negative except where noted in HPI.  Physical Exam: Vital signs in last 24 hours: Temp:  [97.8 F (36.6 C)-98.5 F (36.9 C)] 98.4 F (36.9 C) (05/21 0838) Pulse Rate:  [75-84] 82 (05/21 0838) Resp:  [14-18] 15 (05/21 0838) BP: (93-113)/(50-73) 98/62 (05/21 0838) SpO2:  [98 %-100 %] 100 % (05/21 0838) Weight:  [114.3 kg] 114.3 kg (05/20 1945) Last BM Date: 05/22/19 General:   Alert female in NAD Psych:  Pleasant, cooperative. Normal mood and affect. Eyes:  Pupils equal, sclera  clear, no icterus.   Conjunctiva pink. Ears:  Normal auditory acuity. Nose:  No deformity, discharge,  or lesions. Neck:  Supple; no masses Lungs:  Clear throughout to auscultation.   No wheezes, crackles, or rhonchi.  Heart:  Regular rate and rhythm; no murmurs, no lower extremity edema Abdomen:  Soft, non-distended, mild-mod tenderness in epigastrium. BS active, no palp mass   Rectal:  Deferred  Msk:  Symmetrical without gross deformities. . Neurologic:  Alert and  oriented x4;  grossly normal neurologically. Skin:  Intact without significant lesions or rashes.  Lab Results: Recent Labs    05/26/19 0227  WBC 4.7  HGB 10.5*  HCT 33.0*  PLT 298   BMET Recent Labs    05/26/19 0227  NA 139  K 4.1  CL 109  CO2 23  GLUCOSE 132*  BUN 7  CREATININE 1.01*  CALCIUM 8.7*   LFT Recent Labs    05/26/19 0227  PROT 5.4*  ALBUMIN 3.0*  AST 26  ALT 25  ALKPHOS 37*  BILITOT 0.7   . CBC Latest Ref Rng & Units 05/26/2019  WBC 4.0 - 10.5 K/uL 4.7  Hemoglobin 12.0 - 15.0 g/dL 10.5(L)  Hematocrit 36.0 - 46.0 % 33.0(L)  Platelets 150 - 400 K/uL 298    . CMP Latest Ref Rng & Units 05/26/2019  Glucose 70 - 99 mg/dL 132(H)  BUN 6 - 20 mg/dL 7  Creatinine 0.44 - 1.00 mg/dL 1.01(H)  Sodium 135 - 145 mmol/L 139  Potassium 3.5 - 5.1 mmol/L 4.1  Chloride 98 - 111 mmol/L 109  CO2 22 - 32 mmol/L 23  Calcium 8.9 - 10.3 mg/dL 8.7(L)  Total Protein 6.5 - 8.1 g/dL 5.4(L)  Total Bilirubin 0.3 - 1.2 mg/dL 0.7  Alkaline Phos 38 - 126 U/L 37(L)  AST 15 - 41 U/L 26  ALT 0 - 44 U/L 25     Tye Savoy, NP-C @  05/26/2019, 9:01 AM    ________________________________________________________________________  Velora Heckler GI MD note:  I personally examined the patient, reviewed the data and agree with the assessment and plan described above.  Outside CT pretty convincing  for CBD stone.  I am planning ERCP later today after discussing risks, benefits with her and her mother in the room this  morning (including pancreatitis, perforation, bleeding, sedation complications).  I drew them a biliary diagram as well.  Should consult general surgery to consider cholecystectomy.   Owens Loffler, MD [facility] Pager [phone]

## 2019-05-26 NOTE — Progress Notes (Signed)
Unable to obtain 1600 VS, pt off unit at this time.

## 2019-05-26 NOTE — Anesthesia Procedure Notes (Signed)
Procedure Name: Intubation Date/Time: 05/26/2019 3:42 PM Performed by: Barrington Ellison, CRNA Pre-anesthesia Checklist: Patient identified, Emergency Drugs available, Suction available and Patient being monitored Patient Re-evaluated:Patient Re-evaluated prior to induction Oxygen Delivery Method: Circle System Utilized Preoxygenation: Pre-oxygenation with 100% oxygen Induction Type: IV induction Ventilation: Mask ventilation without difficulty Laryngoscope Size: Mac and 3 Grade View: Grade I Tube type: Oral Tube size: 7.0 mm Number of attempts: 1 Airway Equipment and Method: Stylet and Oral airway Placement Confirmation: ETT inserted through vocal cords under direct vision,  positive ETCO2 and breath sounds checked- equal and bilateral Secured at: 23 cm Tube secured with: Tape Dental Injury: Teeth and Oropharynx as per pre-operative assessment

## 2019-05-26 NOTE — Transfer of Care (Signed)
Immediate Anesthesia Transfer of Care Note  Patient: Dana Kelly  Procedure(s) Performed: ENDOSCOPIC RETROGRADE CHOLANGIOPANCREATOGRAPHY (ERCP) (N/A )  Patient Location: Endoscopy Unit  Anesthesia Type:General  Level of Consciousness: awake, alert  and oriented  Airway & Oxygen Therapy: Patient Spontanous Breathing and Patient connected to nasal cannula oxygen  Post-op Assessment: Report given to RN  Post vital signs: Reviewed and stable  Last Vitals:  Vitals Value Taken Time  BP 144/75 05/26/19 1653  Temp    Pulse 105 05/26/19 1654  Resp 12 05/26/19 1654  SpO2 84 % 05/26/19 1654  Vitals shown include unvalidated device data.  Last Pain:  Vitals:   05/26/19 1435  TempSrc: Oral  PainSc: 5       Patients Stated Pain Goal: 3 (05/26/19 1258)  Complications: No apparent anesthesia complications

## 2019-05-26 NOTE — Consult Note (Addendum)
Referring Provider:  [facility]         Primary Care Physician:  Marylee Floras, FNP Primary Gastroenterologist:   Althia Forts           We were asked to see this patient for:    choledocholithiasis              ASSESSMENT /  PLAN    54 yo female with PMH significant for hyperlipidemia, migraines, partial hysterectomy, appendectomy, ADHD, depression, anxiety, ? PE on Eliquis  # Abdominal pain / choledocholithiasis / cholelithiasisis --LFTs basically normal yesterday in [place] and today but CT scan w/ contrast in [place] suggesting distal CBD stone.  --Plan is for ERCP with stone extraction this afternoon.  The  Benefits and risks of ERCP including infection, perforation, bleeding and pancreatitis were explained to the patient and she agrees to proceed.  --Will need cholecystectomy at some point to prevent a reoccurrence.  # History of PE ( per patient) --On Eliquis at home but none since Tuesday  # Macrocytic anemia.  --Hgb is 10.5, possibly volume related.  --Can further evaluate as outpatient.  --No overt GI bleeding  HPI:    Chief Complaint: abdominal pain  Dana Kelly is a 54 y.o. female transferred from [facility] yesterday. She had presented there with vomiting and RUQ pain. Tbili 0.5, AST 40, ALT 37, alk phos 65, WBC was 7, hgb 12.6, INR 1.5, SARS negative. CT scan w/ contrast showed normal liver, intra/ extrahepatic ductal dilation. CBD 8 mm. A roughly  7 mm rounding high attenuated focus within distal CBD possible representing a stone,  either sludge or stones in gallbladder, moderate colonic stool burden. Due to limited endoscopic ability GI requested she be transferred to [facility].  Patient says her epigastric pain started on Tuesday. Initially it was intermittent then by Wed it had become constant and associated with nausea and vomiting She hasn't been unable to eat anything since Tuesday due to the nausea / pain.  Pain was She had similar pain  in January when hospitalized with PNA and sepsis but doesn't know of any associated gallbladder problems. She has no chronic GI issues, has never seen a GI. No pertinent [place].    PMH:   PNA, ? PE, migraines, hyperlipidemia  PSH: partial hysterectomy, appendectomy  [place]: No colon cancer, no liver disease.   Prior to Admission medications   Medication Sig Start Date End Date Taking? Authorizing Provider  amphetamine-dextroamphetamine (ADDERALL) 30 MG tablet Take 1 tablet by mouth daily. 05/17/19  Yes Mozingo, Berdie Ogren, NP  apixaban (ELIQUIS) 5 MG TABS tablet Take 5 mg by mouth 2 (two) times daily.    Yes [provider]  atorvastatin (LIPITOR) 80 MG tablet Take 80 mg by mouth at bedtime. 04/06/19  Yes [provider]  clonazePAM (KLONOPIN) 2 MG tablet Take 1 tablet (2 mg total) by mouth 3 (three) times daily. 03/22/19  Yes Mozingo, Berdie Ogren, NP  ergotamine-caffeine (CAFERGOT) 1-100 MG tablet Take 2 tablets by mouth as needed for migraine. Take if headache is caught early enough and won't need the sumatriptan shot. Do not exceed 3 doses in 24 hours. 04/06/19  Yes [provider]  FLUoxetine (PROZAC) 20 MG capsule Take 3 capsules daily. Patient taking differently: Take 60 mg by mouth daily.  03/22/19  Yes Mozingo, Berdie Ogren, NP  furosemide (LASIX) 40 MG tablet Take 40 mg by mouth every other day.    Yes [provider]  lisinopril (ZESTRIL)  40 MG tablet Take 40 mg by mouth daily.    Yes [provider]  ondansetron (ZOFRAN-ODT) 4 MG disintegrating tablet Take 4 mg by mouth every 8 (eight) hours as needed for nausea or vomiting.    Yes [provider]  prazosin (MINIPRESS) 2 MG capsule Take 2 mg by mouth at bedtime as needed (nightmares).    Yes [provider]  prochlorperazine (COMPAZINE) 10 MG tablet Take 10 mg by mouth as needed for nausea or vomiting.    Yes [provider]  promethazine (PHENERGAN) 25 MG tablet  Take 25 mg by mouth every 6 (six) hours as needed for nausea or vomiting.    Yes [provider]  SUMAtriptan 6 MG/0.5ML SOAJ Inject 6 mg into the skin as needed (migraine).  05/04/19  Yes [provider]  traZODone (DESYREL) 100 MG tablet Take two to three tablets at bedtime. Patient taking differently: Take 300 mg by mouth at bedtime.  03/22/19  Yes Mozingo, Berdie Ogren, NP  amphetamine-dextroamphetamine (ADDERALL) 30 MG tablet Take 1 tablet by mouth daily. Patient not taking: Reported on 05/25/2019 03/22/19   Mozingo, Berdie Ogren, NP  amphetamine-dextroamphetamine (ADDERALL) 30 MG tablet Take 1 tablet by mouth daily. Patient not taking: Reported on 05/25/2019 04/19/19   Mozingo, Berdie Ogren, NP    Current Facility-Administered Medications  Medication Dose Route Frequency Provider Last Rate Last Admin  . dextrose 5 % in lactated ringers infusion   Intravenous Continuous Elwyn Reach, MD 125 mL/hr at 05/26/19 0345 New Bag at 05/26/19 0345  . meropenem (MERREM) 500 mg in sodium chloride 0.9 % 100 mL IVPB  500 mg Intravenous Q8H Garba, Mohammad L, MD 200 mL/hr at 05/26/19 0508 500 mg at 05/26/19 0508  . morphine 2 MG/ML injection 2 mg  2 mg Intravenous Q2H PRN Elwyn Reach, MD   2 mg at 05/26/19 0850  . ondansetron (ZOFRAN) tablet 4 mg  4 mg Oral Q6H PRN Elwyn Reach, MD       Or  . ondansetron (ZOFRAN) injection 4 mg  4 mg Intravenous Q6H PRN Elwyn Reach, MD        Allergies as of 05/25/2019 - Review Complete 05/25/2019  Allergen Reaction Noted  . Dicyclomine Hives 06/07/2017  . Peanut-containing drug products Anaphylaxis 05/13/2016  . Hydromorphone hcl Other (See Comments) 12/28/2018  . Phenytoin sodium extended  06/07/2017  . Pregabalin Other (See Comments) 10/14/2014  . Sulfa antibiotics  12/28/2018  . Hydromorphone Itching 05/13/2016  . Meloxicam Nausea And Vomiting 06/07/2017    No family history on file.  Social History    Socioeconomic History  . Marital status: Divorced    Spouse name: Not on file  . Number of children: Not on file  . Years of education: Not on file  . Highest education level: Not on file  Occupational History  . Not on file  Tobacco Use  . Smoking status: Never Smoker  . Smokeless tobacco: Never Used  Substance and Sexual Activity  . Alcohol use: Not on file  . Drug use: Not on file  . Sexual activity: Not on file  Other Topics Concern  . Not on file  Social History Narrative  . Not on file   Social Determinants of Health   Financial Resource Strain:   . Difficulty of Paying Living Expenses:   Food Insecurity:   . Worried About Charity fundraiser in the Last Year:   . Arboriculturist in  the Last Year:   Transportation Needs:   . Film/video editor (Medical):   Marland Kitchen Lack of Transportation (Non-Medical):   Physical Activity:   . Days of Exercise per Week:   . Minutes of Exercise per Session:   Stress:   . Feeling of Stress :   Social Connections:   . Frequency of Communication with Friends and Family:   . Frequency of Social Gatherings with Friends and Family:   . Attends Religious Services:   . Active Member of Clubs or Organizations:   . Attends Archivist Meetings:   Marland Kitchen Marital Status:   Intimate Partner Violence:   . Fear of Current or Ex-Partner:   . Emotionally Abused:   Marland Kitchen Physically Abused:   . Sexually Abused:     Review of Systems: All systems reviewed and negative except where noted in HPI.  Physical Exam: Vital signs in last 24 hours: Temp:  [97.8 F (36.6 C)-98.5 F (36.9 C)] 98.4 F (36.9 C) (05/21 0838) Pulse Rate:  [75-84] 82 (05/21 0838) Resp:  [14-18] 15 (05/21 0838) BP: (93-113)/(50-73) 98/62 (05/21 0838) SpO2:  [98 %-100 %] 100 % (05/21 0838) Weight:  [114.3 kg] 114.3 kg (05/20 1945) Last BM Date: 05/22/19 General:   Alert female in NAD Psych:  Pleasant, cooperative. Normal mood and affect. Eyes:  Pupils equal, sclera  clear, no icterus.   Conjunctiva pink. Ears:  Normal auditory acuity. Nose:  No deformity, discharge,  or lesions. Neck:  Supple; no masses Lungs:  Clear throughout to auscultation.   No wheezes, crackles, or rhonchi.  Heart:  Regular rate and rhythm; no murmurs, no lower extremity edema Abdomen:  Soft, non-distended, mild-mod tenderness in epigastrium. BS active, no palp mass   Rectal:  Deferred  Msk:  Symmetrical without gross deformities. . Neurologic:  Alert and  oriented x4;  grossly normal neurologically. Skin:  Intact without significant lesions or rashes.  Lab Results: Recent Labs    05/26/19 0227  WBC 4.7  HGB 10.5*  HCT 33.0*  PLT 298   BMET Recent Labs    05/26/19 0227  NA 139  K 4.1  CL 109  CO2 23  GLUCOSE 132*  BUN 7  CREATININE 1.01*  CALCIUM 8.7*   LFT Recent Labs    05/26/19 0227  PROT 5.4*  ALBUMIN 3.0*  AST 26  ALT 25  ALKPHOS 37*  BILITOT 0.7   . CBC Latest Ref Rng & Units 05/26/2019  WBC 4.0 - 10.5 K/uL 4.7  Hemoglobin 12.0 - 15.0 g/dL 10.5(L)  Hematocrit 36.0 - 46.0 % 33.0(L)  Platelets 150 - 400 K/uL 298    . CMP Latest Ref Rng & Units 05/26/2019  Glucose 70 - 99 mg/dL 132(H)  BUN 6 - 20 mg/dL 7  Creatinine 0.44 - 1.00 mg/dL 1.01(H)  Sodium 135 - 145 mmol/L 139  Potassium 3.5 - 5.1 mmol/L 4.1  Chloride 98 - 111 mmol/L 109  CO2 22 - 32 mmol/L 23  Calcium 8.9 - 10.3 mg/dL 8.7(L)  Total Protein 6.5 - 8.1 g/dL 5.4(L)  Total Bilirubin 0.3 - 1.2 mg/dL 0.7  Alkaline Phos 38 - 126 U/L 37(L)  AST 15 - 41 U/L 26  ALT 0 - 44 U/L 25     Tye Savoy, NP-C @  05/26/2019, 9:01 AM    ________________________________________________________________________  Velora Heckler GI MD note:  I personally examined the patient, reviewed the data and agree with the assessment and plan described above.  Outside CT pretty convincing  for CBD stone.  I am planning ERCP later today after discussing risks, benefits with her and her mother in the room this  morning (including pancreatitis, perforation, bleeding, sedation complications).  I drew them a biliary diagram as well.  Should consult general surgery to consider cholecystectomy.   Owens Loffler, MD [facility] Pager [phone]

## 2019-05-26 NOTE — Plan of Care (Signed)

## 2019-05-26 NOTE — Progress Notes (Signed)
PROGRESS NOTE  Dana Kelly  FYB:[id number] DOB: 04-03-1965 DOA: 05/25/2019 PCP: Drema Halon, FNP   Brief Narrative: Dana Kelly is a 54 y.o. female with a history of HTN, HLD, migraines, obesity, PTSD, depression and anxiety who presented to [facility] [place] [place] due to abdominal pain, nausea and vomiting on 5/20. Work up revealed obstructive jaundice and choledocholithiasis. She was transferred to [facility] for GI evaluation and is to undergo ERCP 5/21.  Assessment & Plan: Principal Problem:   Choledocholithiasis Active Problems:   Anxiety   Essential hypertension   Hyperlipidemia   Seizure disorder (HCC)  Abdominal pain, cholelithiasis, choledocholithiasis: LFTs are benign currently.  - ERCP w/stone extraction/possible sphincterotomy planned today. Pt NPO, diet per GI postprocedure. Will monitor for pancreatitis.  - Will discuss with general surgery after procedure, as the patient may wish to follow up in her hometown for cholecystectomy.  - IV analgesics, antiemetics  History of DVT/PE 2020:  - Restart anticoagulation as soon as cleared by GI. Last dose of eliquis 5/18. - SCDs  HTN:  - Restart home BP medications when taking po including lisinopril, lasix.  HLD:  - Continue statin once taking po  Migraines:  - Continue abortive prn's if needed.  Depression, anxiety, PTSD:  - Continue home medications when able, including trazodone, prazosin, fluoxetine, clonazepam. Pt reports not currently taking adderall.  Macrocytic anemia:  - B12, folic acid with labs in AM  Obesity: Estimated body mass index is 44.64 kg/m as calculated from the following:   Height as of this encounter: 5\' 3"  (1.6 m).   Weight as of this encounter: 114.3 kg.  DVT prophylaxis: SCDs Code Status: Full Family Communication: Husband at bedside Disposition Plan:  Status is: Inpatient  Remains inpatient appropriate because:Ongoing diagnostic testing  needed not appropriate for outpatient work up and IV treatments appropriate due to intensity of illness or inability to take PO  Dispo: The patient is from: Home              Anticipated d/c is to: Home              Anticipated d/c date is: 2 days              Patient currently is not medically stable to d/c.  Consultants:   GI  Procedures:   ERCP 05/26/2019  Antimicrobials:  Meropenem   Subjective: Abdominal pain is and has been described as waxing/waning on the left side of the abdomen per pt today, associated with nausea and vomiting, no po intake for days. No bleeding currently, no dyspnea.  Objective: Vitals:   05/26/19 1211 05/26/19 1400 05/26/19 1435 05/26/19 1655  BP: (!) 96/51 99/60 (!) 104/39 (!) 144/75  Pulse: 75 76 80 (!) 105  Resp: 16 15 13 12   Temp: 98.4 F (36.9 C)  98.4 F (36.9 C) 98.8 F (37.1 C)  TempSrc: Oral Oral Oral Axillary  SpO2: 100% 100% 98% (!) 84%  Weight:      Height:        Intake/Output Summary (Last 24 hours) at 05/26/2019 1701 Last data filed at 05/26/2019 1650 Gross per 24 hour  Intake 672.99 ml  Output 0 ml  Net 672.99 ml   Filed Weights   05/25/19 1945  Weight: 114.3 kg   Gen: 54 y.o. female in no distress Pulm: Non-labored breathing room air. Clear to auscultation bilaterally.  CV: Regular rate and rhythm. No murmur, rub, or gallop. No JVD, no pitting  pedal edema. GI: Abdomen soft, tender mostly in left quadrants, not in RUQ, non-distended, with normoactive bowel sounds. No organomegaly or masses felt. Ext: Warm, no deformities Skin: No rashes, lesions or ulcers Neuro: Alert and oriented. No focal neurological deficits. Psych: Judgement and insight appear normal. Mood & affect appropriate.   Data Reviewed: I have personally reviewed following labs and imaging studies  CBC: Recent Labs  Lab 05/26/19 0227  WBC 4.7  HGB 10.5*  HCT 33.0*  MCV 101.5*  PLT 786   Basic Metabolic Panel: Recent Labs  Lab 05/26/19 0227    NA 139  K 4.1  CL 109  CO2 23  GLUCOSE 132*  BUN 7  CREATININE 1.01*  CALCIUM 8.7*   GFR: Estimated Creatinine Clearance: 80.3 mL/min (A) (by C-G formula based on SCr of 1.01 mg/dL (H)). Liver Function Tests: Recent Labs  Lab 05/26/19 0227  AST 26  ALT 25  ALKPHOS 37*  BILITOT 0.7  PROT 5.4*  ALBUMIN 3.0*   No results for input(s): LIPASE, AMYLASE in the last 168 hours. No results for input(s): AMMONIA in the last 168 hours. Coagulation Profile: No results for input(s): INR, PROTIME in the last 168 hours. Cardiac Enzymes: No results for input(s): CKTOTAL, CKMB, CKMBINDEX, TROPONINI in the last 168 hours. BNP (last 3 results) No results for input(s): PROBNP in the last 8760 hours. HbA1C: No results for input(s): HGBA1C in the last 72 hours. CBG: Recent Labs  Lab 05/26/19 0748 05/26/19 1156  GLUCAP 112* 119*   Lipid Profile: No results for input(s): CHOL, HDL, LDLCALC, TRIG, CHOLHDL, LDLDIRECT in the last 72 hours. Thyroid Function Tests: No results for input(s): TSH, T4TOTAL, FREET4, T3FREE, THYROIDAB in the last 72 hours. Anemia Panel: No results for input(s): VITAMINB12, FOLATE, FERRITIN, TIBC, IRON, RETICCTPCT in the last 72 hours. Urine analysis: No results found for: COLORURINE, APPEARANCEUR, LABSPEC, PHURINE, GLUCOSEU, HGBUR, BILIRUBINUR, KETONESUR, PROTEINUR, UROBILINOGEN, NITRITE, LEUKOCYTESUR No results found for this or any previous visit (from the past 240 hour(s)).    Radiology Studies: No results found.  Scheduled Meds: Continuous Infusions: . dextrose 5% lactated ringers 125 mL/hr at 05/26/19 0345  . [MAR Hold] meropenem (MERREM) IV       LOS: 1 day   Time spent: 25 minutes.  Patrecia Pour, MD [facility] [email or web address] 05/26/2019, 5:01 PM

## 2019-05-27 ENCOUNTER — Encounter (HOSPITAL_COMMUNITY)
Admission: AD | Disposition: A | Discharge status: Home / Self Care | Payer: Self-pay | Source: Other Acute Inpatient Hospital | Attending: Family Medicine

## 2019-05-27 ENCOUNTER — Inpatient Hospital Stay (HOSPITAL_COMMUNITY): Payer: PRIVATE HEALTH INSURANCE | Admitting: Certified Registered"

## 2019-05-27 HISTORY — PX: CHOLECYSTECTOMY: SHX55

## 2019-05-27 LAB — COMPREHENSIVE METABOLIC PANEL
ALT: 82 U/L — ABNORMAL HIGH (ref 0–44)
AST: 100 U/L — ABNORMAL HIGH (ref 15–41)
Albumin: 3.3 g/dL — ABNORMAL LOW (ref 3.5–5.0)
Alkaline Phosphatase: 102 U/L (ref 38–126)
Anion gap: 13 (ref 5–15)
BUN: 9 mg/dL (ref 6–20)
CO2: 25 mmol/L (ref 22–32)
Calcium: 9.6 mg/dL (ref 8.9–10.3)
Chloride: 102 mmol/L (ref 98–111)
Creatinine, Ser: 1.19 mg/dL — ABNORMAL HIGH (ref 0.44–1.00)
GFR calc Af Amer: >60 mL/min (ref >60)
GFR calc non Af Amer: 53 mL/min — ABNORMAL LOW (ref >60)
Glucose, Bld: 130 mg/dL — ABNORMAL HIGH (ref 70–99)
Potassium: 5.3 mmol/L — ABNORMAL HIGH (ref 3.5–5.1)
Sodium: 140 mmol/L (ref 135–145)
Total Bilirubin: 0.6 mg/dL (ref 0.3–1.2)
Total Protein: 6.1 g/dL — ABNORMAL LOW (ref 6.5–8.1)

## 2019-05-27 LAB — CBC
HCT: 36.4 % (ref 36.0–46.0)
Hemoglobin: 12.3 g/dL (ref 12.0–15.0)
MCH: 32.6 pg (ref 26.0–34.0)
MCHC: 33.8 g/dL (ref 30.0–36.0)
MCV: 96.6 fL (ref 80.0–100.0)
Platelets: 330 10*3/uL (ref 150–400)
RBC: 3.77 MIL/uL — ABNORMAL LOW (ref 3.87–5.11)
RDW: 12.5 % (ref 11.5–15.5)
WBC: 17.6 10*3/uL — ABNORMAL HIGH (ref 4.0–10.5)
nRBC: 0 % (ref 0.0–0.2)

## 2019-05-27 LAB — LIPASE, BLOOD: Lipase: 564 U/L — ABNORMAL HIGH (ref 11–51)

## 2019-05-27 LAB — GLUCOSE, CAPILLARY
Glucose-Capillary: 122 mg/dL — ABNORMAL HIGH (ref 70–99)
Glucose-Capillary: 139 mg/dL — ABNORMAL HIGH (ref 70–99)

## 2019-05-27 LAB — VITAMIN B12: Vitamin B-12: 546 pg/mL (ref 180–914)

## 2019-05-27 LAB — FOLATE: Folate: 6.4 ng/mL (ref >5.9)

## 2019-05-27 SURGERY — LAPAROSCOPIC CHOLECYSTECTOMY
Anesthesia: General | Site: Abdomen

## 2019-05-27 MED ORDER — PHENYLEPHRINE 40 MCG/ML (10ML) SYRINGE FOR IV PUSH (FOR BLOOD PRESSURE SUPPORT)
PREFILLED_SYRINGE | INTRAVENOUS | Status: DC | PRN
  Administered 2019-05-27: 80 ug via INTRAVENOUS
  Administered 2019-05-27: 120 ug via INTRAVENOUS

## 2019-05-27 MED ORDER — PROPOFOL 10 MG/ML IV BOLUS
INTRAVENOUS | Status: AC
  Filled 2019-05-27: qty 20

## 2019-05-27 MED ORDER — CLONAZEPAM 1 MG PO TABS
2.0000 mg | ORAL_TABLET | Freq: Three times a day (TID) | ORAL | Status: DC
  Administered 2019-05-27 – 2019-05-28 (×3): 2 mg via ORAL
  Filled 2019-05-27 (×3): qty 2

## 2019-05-27 MED ORDER — FLUOXETINE HCL 20 MG PO CAPS
60.0000 mg | ORAL_CAPSULE | Freq: Every day | ORAL | Status: DC
  Administered 2019-05-27 – 2019-05-28 (×2): 60 mg via ORAL
  Filled 2019-05-27 (×2): qty 3

## 2019-05-27 MED ORDER — ONDANSETRON HCL 4 MG/2ML IJ SOLN
INTRAMUSCULAR | Status: AC
  Filled 2019-05-27: qty 2

## 2019-05-27 MED ORDER — GABAPENTIN 300 MG PO CAPS
ORAL_CAPSULE | ORAL | Status: AC
  Administered 2019-05-27: 300 mg via ORAL
  Filled 2019-05-27: qty 1

## 2019-05-27 MED ORDER — LACTATED RINGERS IV SOLN: INTRAVENOUS | Status: DC | PRN

## 2019-05-27 MED ORDER — HEMOSTATIC AGENTS (NO CHARGE) OPTIME
TOPICAL | Status: DC | PRN
  Administered 2019-05-27: 1 via TOPICAL

## 2019-05-27 MED ORDER — PROPOFOL 10 MG/ML IV BOLUS
INTRAVENOUS | Status: DC | PRN
  Administered 2019-05-27: 140 mg via INTRAVENOUS

## 2019-05-27 MED ORDER — DEXAMETHASONE SODIUM PHOSPHATE 4 MG/ML IJ SOLN
INTRAMUSCULAR | Status: DC | PRN
  Administered 2019-05-27: 4 mg via INTRAVENOUS

## 2019-05-27 MED ORDER — BUPIVACAINE-EPINEPHRINE 0.25% -1:200000 IJ SOLN
INTRAMUSCULAR | Status: DC | PRN
  Administered 2019-05-27: 30 mL

## 2019-05-27 MED ORDER — SODIUM CHLORIDE 0.9 % IR SOLN
Status: DC | PRN
  Administered 2019-05-27: 1000 mL

## 2019-05-27 MED ORDER — LIDOCAINE 2% (20 MG/ML) 5 ML SYRINGE
INTRAMUSCULAR | Status: AC
  Filled 2019-05-27: qty 5

## 2019-05-27 MED ORDER — MIDAZOLAM HCL 2 MG/2ML IJ SOLN
INTRAMUSCULAR | Status: AC
  Filled 2019-05-27: qty 2

## 2019-05-27 MED ORDER — SUCCINYLCHOLINE CHLORIDE 200 MG/10ML IV SOSY
PREFILLED_SYRINGE | INTRAVENOUS | Status: AC
  Filled 2019-05-27: qty 10

## 2019-05-27 MED ORDER — CEFAZOLIN SODIUM-DEXTROSE 2-4 GM/100ML-% IV SOLN
INTRAVENOUS | Status: AC
  Filled 2019-05-27: qty 100

## 2019-05-27 MED ORDER — FENTANYL CITRATE (PF) 250 MCG/5ML IJ SOLN
INTRAMUSCULAR | Status: AC
  Filled 2019-05-27: qty 5

## 2019-05-27 MED ORDER — TRAZODONE HCL 150 MG PO TABS
300.0000 mg | ORAL_TABLET | Freq: Every day | ORAL | Status: DC
  Administered 2019-05-27: 300 mg via ORAL
  Filled 2019-05-27: qty 2

## 2019-05-27 MED ORDER — FENTANYL CITRATE (PF) 100 MCG/2ML IJ SOLN
INTRAMUSCULAR | Status: DC | PRN
  Administered 2019-05-27 (×2): 25 ug via INTRAVENOUS
  Administered 2019-05-27: 100 ug via INTRAVENOUS

## 2019-05-27 MED ORDER — LIDOCAINE 5 % EX PTCH
1.0000 | MEDICATED_PATCH | CUTANEOUS | Status: DC
  Administered 2019-05-27: 1 via TRANSDERMAL
  Filled 2019-05-27: qty 1

## 2019-05-27 MED ORDER — FENTANYL CITRATE (PF) 100 MCG/2ML IJ SOLN
25.0000 ug | INTRAMUSCULAR | Status: DC | PRN
  Administered 2019-05-27 (×2): 50 ug via INTRAVENOUS

## 2019-05-27 MED ORDER — MIDAZOLAM HCL 5 MG/5ML IJ SOLN
INTRAMUSCULAR | Status: DC | PRN
  Administered 2019-05-27 (×2): 2 mg via INTRAVENOUS

## 2019-05-27 MED ORDER — FUROSEMIDE 40 MG PO TABS
40.0000 mg | ORAL_TABLET | Freq: Every day | ORAL | Status: DC
  Administered 2019-05-28: 40 mg via ORAL
  Filled 2019-05-27: qty 1

## 2019-05-27 MED ORDER — LIDOCAINE HCL (CARDIAC) PF 100 MG/5ML IV SOSY
PREFILLED_SYRINGE | INTRAVENOUS | Status: DC | PRN
  Administered 2019-05-27: 80 mg via INTRAVENOUS

## 2019-05-27 MED ORDER — ACETAMINOPHEN 500 MG PO TABS
ORAL_TABLET | ORAL | Status: AC
  Administered 2019-05-27: 1000 mg via ORAL
  Filled 2019-05-27: qty 2

## 2019-05-27 MED ORDER — ENOXAPARIN SODIUM 40 MG/0.4ML ~~LOC~~ SOLN
40.0000 mg | SUBCUTANEOUS | Status: DC
  Administered 2019-05-27: 40 mg via SUBCUTANEOUS
  Filled 2019-05-27: qty 0.4

## 2019-05-27 MED ORDER — PROMETHAZINE HCL 25 MG/ML IJ SOLN: 6.2500 mg | INTRAMUSCULAR | Status: DC | PRN

## 2019-05-27 MED ORDER — DEXAMETHASONE SODIUM PHOSPHATE 10 MG/ML IJ SOLN
INTRAMUSCULAR | Status: AC
  Filled 2019-05-27: qty 1

## 2019-05-27 MED ORDER — ACETAMINOPHEN 500 MG PO TABS: 1000.0000 mg | ORAL_TABLET | ORAL | Status: AC

## 2019-05-27 MED ORDER — CEFAZOLIN SODIUM-DEXTROSE 2-4 GM/100ML-% IV SOLN
2.0000 g | INTRAVENOUS | Status: AC
  Administered 2019-05-27: 3 g via INTRAVENOUS
  Filled 2019-05-27 (×2): qty 100

## 2019-05-27 MED ORDER — SUGAMMADEX SODIUM 200 MG/2ML IV SOLN
INTRAVENOUS | Status: DC | PRN
  Administered 2019-05-27: 250 mg via INTRAVENOUS

## 2019-05-27 MED ORDER — 0.9 % SODIUM CHLORIDE (POUR BTL) OPTIME
TOPICAL | Status: DC | PRN
  Administered 2019-05-27: 1000 mL

## 2019-05-27 MED ORDER — SODIUM ZIRCONIUM CYCLOSILICATE 5 G PO PACK
5.0000 g | PACK | Freq: Once | ORAL | Status: AC
  Administered 2019-05-27: 5 g via ORAL
  Filled 2019-05-27: qty 1

## 2019-05-27 MED ORDER — GABAPENTIN 300 MG PO CAPS: 300.0000 mg | ORAL_CAPSULE | ORAL | Status: AC

## 2019-05-27 MED ORDER — ROCURONIUM BROMIDE 10 MG/ML (PF) SYRINGE
PREFILLED_SYRINGE | INTRAVENOUS | Status: AC
  Filled 2019-05-27: qty 10

## 2019-05-27 MED ORDER — ROCURONIUM BROMIDE 100 MG/10ML IV SOLN
INTRAVENOUS | Status: DC | PRN
  Administered 2019-05-27: 60 mg via INTRAVENOUS

## 2019-05-27 MED ORDER — ACETAMINOPHEN 325 MG PO TABS
650.0000 mg | ORAL_TABLET | Freq: Four times a day (QID) | ORAL | Status: DC
  Administered 2019-05-27 – 2019-05-28 (×4): 650 mg via ORAL
  Filled 2019-05-27 (×4): qty 2

## 2019-05-27 MED ORDER — OXYCODONE HCL 5 MG PO TABS
5.0000 mg | ORAL_TABLET | Freq: Four times a day (QID) | ORAL | Status: DC | PRN
  Administered 2019-05-27 – 2019-05-28 (×4): 5 mg via ORAL
  Filled 2019-05-27 (×4): qty 1

## 2019-05-27 MED ORDER — BUPIVACAINE HCL (PF) 0.25 % IJ SOLN
INTRAMUSCULAR | Status: AC
  Filled 2019-05-27: qty 30

## 2019-05-27 MED ORDER — FENTANYL CITRATE (PF) 100 MCG/2ML IJ SOLN
INTRAMUSCULAR | Status: AC
  Filled 2019-05-27: qty 2

## 2019-05-27 SURGICAL SUPPLY — 46 items
APPLIER CLIP 5 13 M/L LIGAMAX5 (MISCELLANEOUS) ×3
BLADE CLIPPER SURG (BLADE) IMPLANT
CANISTER SUCT 3000ML PPV (MISCELLANEOUS) ×3 IMPLANT
CHLORAPREP W/TINT 26 (MISCELLANEOUS) ×6 IMPLANT
CLIP APPLIE 5 13 M/L LIGAMAX5 (MISCELLANEOUS) ×1 IMPLANT
CLOSURE WOUND 1/2 X4 (GAUZE/BANDAGES/DRESSINGS) ×1
COVER SURGICAL LIGHT HANDLE (MISCELLANEOUS) ×3 IMPLANT
COVER WAND RF STERILE (DRAPES) ×3 IMPLANT
DERMABOND ADHESIVE PROPEN (GAUZE/BANDAGES/DRESSINGS) ×2
DERMABOND ADVANCED (GAUZE/BANDAGES/DRESSINGS) ×2
DERMABOND ADVANCED .7 DNX12 (GAUZE/BANDAGES/DRESSINGS) ×1 IMPLANT
DERMABOND ADVANCED .7 DNX6 (GAUZE/BANDAGES/DRESSINGS) ×1 IMPLANT
ELECT REM PT RETURN 9FT ADLT (ELECTROSURGICAL) ×3
ELECTRODE REM PT RTRN 9FT ADLT (ELECTROSURGICAL) ×1 IMPLANT
GLOVE BIO SURGEON STRL SZ 6.5 (GLOVE) ×2 IMPLANT
GLOVE BIO SURGEON STRL SZ7 (GLOVE) ×9 IMPLANT
GLOVE BIO SURGEONS STRL SZ 6.5 (GLOVE) ×1
GLOVE BIOGEL PI IND STRL 6.5 (GLOVE) ×2 IMPLANT
GLOVE BIOGEL PI IND STRL 7.5 (GLOVE) ×1 IMPLANT
GLOVE BIOGEL PI INDICATOR 6.5 (GLOVE) ×4
GLOVE BIOGEL PI INDICATOR 7.5 (GLOVE) ×2
GLOVE SURG SS PI 6.5 STRL IVOR (GLOVE) ×3 IMPLANT
GOWN STRL REUS W/ TWL LRG LVL3 (GOWN DISPOSABLE) ×4 IMPLANT
GOWN STRL REUS W/TWL LRG LVL3 (GOWN DISPOSABLE) ×8
GRASPER SUT TROCAR 14GX15 (MISCELLANEOUS) ×3 IMPLANT
KIT BASIN OR (CUSTOM PROCEDURE TRAY) ×3 IMPLANT
KIT TURNOVER KIT B (KITS) ×3 IMPLANT
NS IRRIG 1000ML POUR BTL (IV SOLUTION) ×3 IMPLANT
PAD ARMBOARD 7.5X6 YLW CONV (MISCELLANEOUS) ×3 IMPLANT
POUCH RETRIEVAL ECOSAC 10 (ENDOMECHANICALS) ×1 IMPLANT
POUCH RETRIEVAL ECOSAC 10MM (ENDOMECHANICALS) ×2
SCISSORS LAP 5X35 DISP (ENDOMECHANICALS) ×3 IMPLANT
SET IRRIG TUBING LAPAROSCOPIC (IRRIGATION / IRRIGATOR) ×3 IMPLANT
SET TUBE SMOKE EVAC HIGH FLOW (TUBING) ×3 IMPLANT
SLEEVE ENDOPATH XCEL 5M (ENDOMECHANICALS) ×6 IMPLANT
SPECIMEN JAR SMALL (MISCELLANEOUS) ×3 IMPLANT
STRIP CLOSURE SKIN 1/2X4 (GAUZE/BANDAGES/DRESSINGS) ×2 IMPLANT
SUT MNCRL AB 4-0 PS2 18 (SUTURE) ×3 IMPLANT
SUT VICRYL 0 UR6 27IN ABS (SUTURE) ×3 IMPLANT
TOWEL GREEN STERILE (TOWEL DISPOSABLE) ×3 IMPLANT
TOWEL GREEN STERILE FF (TOWEL DISPOSABLE) ×3 IMPLANT
TRAY LAPAROSCOPIC MC (CUSTOM PROCEDURE TRAY) ×3 IMPLANT
TROCAR XCEL 12X100 BLDLESS (ENDOMECHANICALS) ×3 IMPLANT
TROCAR XCEL BLUNT TIP 100MML (ENDOMECHANICALS) ×3 IMPLANT
TROCAR XCEL NON-BLD 5MMX100MML (ENDOMECHANICALS) ×3 IMPLANT
WATER STERILE IRR 1000ML POUR (IV SOLUTION) ×3 IMPLANT

## 2019-05-27 NOTE — Anesthesia Preprocedure Evaluation (Addendum)
Anesthesia Evaluation  Patient identified by MRN, date of birth, ID band Patient awake    Reviewed: Allergy & Precautions, NPO status , Patient's Chart, lab work & pertinent test results  Airway Mallampati: II  TM Distance: >3 FB Neck ROM: Full    Dental  (+) Teeth Intact, Dental Advisory Given   Pulmonary neg pulmonary ROS,    Pulmonary exam normal breath sounds clear to auscultation       Cardiovascular hypertension, Pt. on medications Normal cardiovascular exam Rhythm:Regular Rate:Normal     Neuro/Psych  Headaches, Seizures -,  PSYCHIATRIC DISORDERS Anxiety Depression    GI/Hepatic negative GI ROS, CHOLECYSTITIS   Endo/Other  Morbid obesity  Renal/GU negative Renal ROS     Musculoskeletal negative musculoskeletal ROS (+)   Abdominal   Peds  Hematology  (+) Blood dyscrasia (Eliquis), anemia ,   Anesthesia Other Findings Day of surgery medications reviewed with the patient.  Reproductive/Obstetrics                            Anesthesia Physical Anesthesia Plan  ASA: III  Anesthesia Plan: General   Post-op Pain Management:    Induction: Intravenous  PONV Risk Score and Plan: 4 or greater and Diphenhydramine, Midazolam, Dexamethasone and Ondansetron  Airway Management Planned: Oral ETT  Additional Equipment:   Intra-op Plan:   Post-operative Plan: Extubation in OR  Informed Consent: I have reviewed the patients History and Physical, chart, labs and discussed the procedure including the risks, benefits and alternatives for the proposed anesthesia with the patient or authorized representative who has indicated his/her understanding and acceptance.     Dental advisory given  Plan Discussed with: CRNA  Anesthesia Plan Comments:        Anesthesia Quick Evaluation

## 2019-05-27 NOTE — Anesthesia Postprocedure Evaluation (Signed)
Anesthesia Post Note  Patient: Dana Kelly  Procedure(s) Performed: ENDOSCOPIC RETROGRADE CHOLANGIOPANCREATOGRAPHY (ERCP) (N/A ) SPHINCTEROTOMY     Patient location during evaluation: PACU Anesthesia Type: General Level of consciousness: awake and alert Pain management: pain level controlled Vital Signs Assessment: post-procedure vital signs reviewed and stable Respiratory status: spontaneous breathing, nonlabored ventilation, respiratory function stable and patient connected to nasal cannula oxygen Cardiovascular status: blood pressure returned to baseline and stable Postop Assessment: no apparent nausea or vomiting Anesthetic complications: no    Last Vitals:  Vitals:   05/27/19 0506 05/27/19 0859  BP: (!) 105/59 (!) 144/123  Pulse: 74 85  Resp: 17 18  Temp: 36.7 C 36.9 C  SpO2: 91% 100%    Last Pain:  Vitals:   05/27/19 0859  TempSrc: Oral  PainSc:                  Cecile Hearing

## 2019-05-27 NOTE — Transfer of Care (Signed)
Immediate Anesthesia Transfer of Care Note  Patient: Dana Kelly  Procedure(s) Performed: LAPAROSCOPIC CHOLECYSTECTOMY (N/A Abdomen)  Patient Location: PACU  Anesthesia Type:General  Level of Consciousness: awake, alert , oriented and patient cooperative  Airway & Oxygen Therapy: Patient Spontanous Breathing and Patient connected to nasal cannula oxygen  Post-op Assessment: Report given to RN and Post -op Vital signs reviewed and stable  Post vital signs: Reviewed and stable  Last Vitals:  Vitals Value Taken Time  BP    Temp 36.9 C 05/27/19 1125  Pulse    Resp    SpO2      Last Pain:  Vitals:   05/27/19 0859  TempSrc: Oral  PainSc:       Patients Stated Pain Goal: 3 (05/26/19 1258)  Complications: No apparent anesthesia complications

## 2019-05-27 NOTE — Progress Notes (Signed)
PROGRESS NOTE  Dana Kelly  YSA:[id number] DOB: [birth year] DOA: 05/25/2019 PCP: Marylee Floras, FNP   Brief Narrative: Dana Kelly is a 54 y.o. female with a history of HTN, HLD, migraines, obesity, PTSD, depression and anxiety who presented to [facility] due to abdominal pain, nausea and vomiting on 5/20. Work up revealed obstructive jaundice and choledocholithiasis. She was transferred to [facility] for GI evaluation. Underwent ERCP 5/21.  Assessment & Plan: Principal Problem:   Choledocholithiasis Active Problems:   Anxiety   Essential hypertension   Hyperlipidemia   Seizure disorder (HCC)  Abdominal pain, cholelithiasis, choledocholithiasis: s/p ERCP 5/21 with sphincterotomy and laparoscopic cholecystectomy 5/22 - Postoperative diet per GS - IV analgesics, antiemetics - Monitor LFTs.  History of DVT/PE 2020:  - Restart anticoagulation as soon as cleared by GI and surgery. Last dose of eliquis 5/18. - SCDs  HTN:  - Restart home BP medications when taking po including lasix. Will hold lisinopril with slight bump in creatinine with mild hyperkalemia.   Mild hyperkalemia:  - Lasix restarting as above.  - Give lokelma x1 and recheck in AM. Had BM this AM.  HLD:  - Continue statin once LFTs normalize.  Migraines:  - Continue abortive prn's if needed.  Depression, anxiety, PTSD:  - Continue home medications when able, including trazodone, fluoxetine, clonazepam. Pt reports not currently taking adderall.  Macrocytic anemia: N23, folic acid are wnl. Anemia improved.  Obesity: Estimated body mass index is 44.64 kg/m as calculated from the following:   Height as of this encounter: 5\' 3"  (1.6 m).   Weight as of this encounter: 114.3 kg.  DVT prophylaxis: SCDs Code Status: Full Family Communication: Husband at bedside Disposition Plan:  Status is: Inpatient  Remains inpatient appropriate because:Ongoing diagnostic  testing needed not appropriate for outpatient work up and IV treatments appropriate due to intensity of illness or inability to take PO  Dispo: The patient is from: Home              Anticipated d/c is to: Home              Anticipated d/c date is: 2 days              Patient currently is not medically stable to d/c.  Consultants:   GI  Procedures:   ERCP 05/26/2019 Dr. Ardis Hughs: Impression:               - Dilated CBD, without obvious filling defects                            however given the CT report and non drainage of the                            dye I performed a small biliary sphincterotomy and                            then swept the bile duct several times. No stones                            or purulence.                           - The cystic duct and gallbladder  opacified with                            dye and so clearly no acute cholecystitis Recommendation:           - Observe, advance diet.                           - I am going to stop her antibiotics given findings                            today.                           - Hospitalist to consult surgery to consider GB                            surgery.  Antimicrobials:  Meropenem   Subjective: Abdominal pain is and has been described as waxing/waning on the left side of the abdomen per pt today, associated with nausea and vomiting, no po intake for days. No bleeding currently, no dyspnea.  Objective: Vitals:   05/27/19 0859 05/27/19 1125 05/27/19 1140 05/27/19 1224  BP: (!) 144/123 (!) 145/116 (!) 147/101 (!) 143/63  Pulse: 85 73 68 78  Resp: 18 (!) 22 14 18   Temp: 98.5 F (36.9 C) 98.4 F (36.9 C) 98.3 F (36.8 C) 97.8 F (36.6 C)  TempSrc: Oral   Oral  SpO2: 100% 100% 100% 95%  Weight:      Height:        Intake/Output Summary (Last 24 hours) at 05/27/2019 1414 Last data filed at 05/27/2019 1300 Gross per 24 hour  Intake 1500 ml  Output 10 ml  Net 1490 ml   Filed Weights   05/25/19  1945  Weight: 114.3 kg   Gen: 54 y.o. female in no distress Pulm: Non-labored breathing room air. Clear to auscultation bilaterally.  CV: Regular rate and rhythm. No murmur, rub, or gallop. No JVD, no pitting pedal edema. GI: Abdomen soft, tender mostly in left quadrants, not in RUQ, non-distended, with normoactive bowel sounds. No organomegaly or masses felt. Ext: Warm, no deformities Skin: No rashes, lesions or ulcers Neuro: Alert and oriented. No focal neurological deficits. Psych: Judgement and insight appear normal. Mood & affect appropriate.   Data Reviewed: I have personally reviewed following labs and imaging studies  CBC: Recent Labs  Lab 05/26/19 0227 05/27/19 1330  WBC 4.7 17.6*  HGB 10.5* 12.3  HCT 33.0* 36.4  MCV 101.5* 96.6  PLT 298 330   Basic Metabolic Panel: Recent Labs  Lab 05/26/19 0227 05/27/19 0715  NA 139 140  K 4.1 5.3*  CL 109 102  CO2 23 25  GLUCOSE 132* 130*  BUN 7 9  CREATININE 1.01* 1.19*  CALCIUM 8.7* 9.6   GFR: Estimated Creatinine Clearance: 68.2 mL/min (A) (by C-G formula based on SCr of 1.19 mg/dL (H)). Liver Function Tests: Recent Labs  Lab 05/26/19 0227 05/27/19 0715  AST 26 100*  ALT 25 82*  ALKPHOS 37* 102  BILITOT 0.7 0.6  PROT 5.4* 6.1*  ALBUMIN 3.0* 3.3*   No results for input(s): LIPASE, AMYLASE in the last 168 hours. No results for input(s): AMMONIA in the last 168 hours. Coagulation Profile: No results for input(s): INR, PROTIME in  the last 168 hours. Cardiac Enzymes: No results for input(s): CKTOTAL, CKMB, CKMBINDEX, TROPONINI in the last 168 hours. BNP (last 3 results) No results for input(s): PROBNP in the last 8760 hours. HbA1C: No results for input(s): HGBA1C in the last 72 hours. CBG: Recent Labs  Lab 05/26/19 0748 05/26/19 1156 05/26/19 2119 05/27/19 0808 05/27/19 1225  GLUCAP 112* 119* 147* 122* 139*   Lipid Profile: No results for input(s): CHOL, HDL, LDLCALC, TRIG, CHOLHDL, LDLDIRECT in the  last 72 hours. Thyroid Function Tests: No results for input(s): TSH, T4TOTAL, FREET4, T3FREE, THYROIDAB in the last 72 hours. Anemia Panel: Recent Labs    05/27/19 0715  VITAMINB12 546  FOLATE 6.4   Urine analysis: No results found for: COLORURINE, APPEARANCEUR, LABSPEC, PHURINE, GLUCOSEU, HGBUR, BILIRUBINUR, KETONESUR, PROTEINUR, UROBILINOGEN, NITRITE, LEUKOCYTESUR No results found for this or any previous visit (from the past 240 hour(s)).    Radiology Studies: DG ERCP BILIARY & PANCREATIC DUCTS  Result Date: 05/26/2019 CLINICAL DATA:  Bile duct stone. EXAM: ERCP TECHNIQUE: Multiple spot images obtained with the fluoroscopic device and submitted for interpretation post-procedure. COMPARISON:  None. FLUOROSCOPY TIME:  4 minutes, 9 seconds FINDINGS: Two spot intraoperative fluoroscopic images of the right upper abdominal quadrant during ERCP are provided for review Initial image demonstrates an ERCP probe overlying the right upper abdominal quadrant. There is selective cannulation opacification of the common bile duct which appears normal to very mildly dilated. There is minimal opacification of the intrahepatic biliary system which appears nondilated. There is minimal opacification of the cystic duct with faint opacification of gallbladder. No definitive opacification of the pancreatic duct. No discrete filling defects are seen within the opacified portions of the biliary system to suggest the presence of choledocholithiasis. IMPRESSION: ERCP as above. These images were submitted for radiologic interpretation only. Please see the procedural report for the amount of contrast and the fluoroscopy time utilized. Electronically Signed   By: Simonne Come M.D.   On: 05/26/2019 17:34    Scheduled Meds: . acetaminophen  650 mg Oral Q6H  . clonazePAM  2 mg Oral TID  . enoxaparin (LOVENOX) injection  40 mg Subcutaneous Q24H  . FLUoxetine  60 mg Oral Daily  . [START ON 05/28/2019] furosemide  40 mg Oral  Daily  . lidocaine  1 patch Transdermal Q24H  . traZODone  300 mg Oral QHS   Continuous Infusions:    LOS: 2 days   Time spent: 25 minutes.  Tyrone Nine, MD [facility] [email or web address] 05/27/2019, 2:14 PM

## 2019-05-27 NOTE — Anesthesia Procedure Notes (Signed)
Procedure Name: Intubation Date/Time: 05/27/2019 10:12 AM Performed by: Sammie Bench, CRNA Pre-anesthesia Checklist: Patient identified, Emergency Drugs available, Suction available and Patient being monitored Patient Re-evaluated:Patient Re-evaluated prior to induction Oxygen Delivery Method: Circle System Utilized Preoxygenation: Pre-oxygenation with 100% oxygen Induction Type: IV induction Ventilation: Mask ventilation without difficulty Laryngoscope Size: Mac and 3 Grade View: Grade I Tube type: Oral Tube size: 7.0 mm Number of attempts: 1 Airway Equipment and Method: Stylet and Oral airway Placement Confirmation: ETT inserted through vocal cords under direct vision,  positive ETCO2 and breath sounds checked- equal and bilateral Secured at: 22 cm Tube secured with: Tape Dental Injury: Teeth and Oropharynx as per pre-operative assessment

## 2019-05-27 NOTE — Discharge Instructions (Signed)
[facility] LAPAROSCOPIC SURGERY: POST OP INSTRUCTIONS Always review your discharge instruction sheet given to you by the facility where your surgery was performed. IF YOU HAVE DISABILITY OR FAMILY LEAVE FORMS, YOU MUST BRING THEM TO THE OFFICE FOR PROCESSING.   DO NOT GIVE THEM TO YOUR DOCTOR.  PAIN CONTROL  1. First take acetaminophen (Tylenol) AND/or ibuprofen (Advil) to control your pain after surgery.  Follow directions on package.  Taking acetaminophen (Tylenol) and/or ibuprofen (Advil) regularly after surgery will help to control your pain and lower the amount of prescription pain medication you may need.  You should not take more than 3,000 mg (3 grams) of acetaminophen (Tylenol) in 24 hours.  You should not take ibuprofen (Advil), aleve, motrin, naprosyn or other NSAIDS if you have a history of stomach ulcers or chronic kidney disease.  2. A prescription for pain medication may be given to you upon discharge.  Take your pain medication as prescribed, if you still have uncontrolled pain after taking acetaminophen (Tylenol) or ibuprofen (Advil). 3. Use ice packs to help control pain. 4. If you need a refill on your pain medication, please contact your pharmacy.  They will contact our office to request authorization. Prescriptions will not be filled after 5pm or on week-ends.  HOME MEDICATIONS 5. Take your usually prescribed medications unless otherwise directed.  DIET 6. You should follow a light diet the first few days after arrival home.  Be sure to include lots of fluids daily. Avoid fatty, fried foods.   CONSTIPATION 7. It is common to experience some constipation after surgery and if you are taking pain medication.  Increasing fluid intake and taking a stool softener (such as Colace) will usually help or prevent this problem from occurring.  A mild laxative (Milk of Magnesia or Miralax) should be taken according to package instructions if there are no bowel  movements after 48 hours.  WOUND/INCISION CARE 8. Most patients will experience some swelling and bruising in the area of the incisions.  Ice packs will help.  Swelling and bruising can take several days to resolve.  9. Unless discharge instructions indicate otherwise, follow guidelines below  a. STERI-STRIPS - you may remove your outer bandages 48 hours after surgery, and you may shower at that time.  You have steri-strips (small skin tapes) in place directly over the incision.  These strips should be left on the skin for 7-10 days.   b. DERMABOND/SKIN GLUE - you may shower in 24 hours.  The glue will flake off over the next 2-3 weeks. 10. Any sutures or staples will be removed at the office during your follow-up visit.  ACTIVITIES 11. You may resume regular (light) daily activities beginning the next day--such as daily self-care, walking, climbing stairs--gradually increasing activities as tolerated.  You may have sexual intercourse when it is comfortable.  Refrain from any heavy lifting or straining until approved by your doctor. a. You may drive when you are no longer taking prescription pain medication, you can comfortably wear a seatbelt, and you can safely maneuver your car and apply brakes.  FOLLOW-UP 12. You should see your doctor in the office for a follow-up appointment approximately 2-3 weeks after your surgery.  You should have been given your post-op/follow-up appointment when your surgery was scheduled.  If you did not receive a post-op/follow-up appointment, make sure that you call for this appointment within a day or two after you arrive home to insure a convenient appointment time.  WHEN   TO CALL YOUR DOCTOR: 1. Fever over 101.0 2. Inability to urinate 3. Continued bleeding from incision. 4. Increased pain, redness, or drainage from the incision. 5. Increasing abdominal pain  The clinic staff is available to answer your questions during regular business hours.  Please don't  hesitate to call and ask to speak to one of the nurses for clinical concerns.  If you have a medical emergency, go to the nearest emergency room or call 911.  A surgeon from [facility] is always on call at the hospital. [address], [address], [place], [place]  [address] ? [address] [address], [place], [place]   [address] [phone] ? [phone] ? FAX [phone] Web site: [email or web address]  

## 2019-05-27 NOTE — Anesthesia Postprocedure Evaluation (Signed)
Anesthesia Post Note  Patient: Dana Kelly  Procedure(s) Performed: LAPAROSCOPIC CHOLECYSTECTOMY (N/A Abdomen)     Patient location during evaluation: PACU Anesthesia Type: General Level of consciousness: awake and alert Pain management: pain level controlled Vital Signs Assessment: post-procedure vital signs reviewed and stable Respiratory status: spontaneous breathing, nonlabored ventilation, respiratory function stable and patient connected to nasal cannula oxygen Cardiovascular status: blood pressure returned to baseline and stable Postop Assessment: no apparent nausea or vomiting Anesthetic complications: no    Last Vitals:  Vitals:   05/27/19 1140 05/27/19 1224  BP: (!) 147/101 (!) 143/63  Pulse: 68 78  Resp: 14 18  Temp: 36.8 C 36.6 C  SpO2: 100% 95%    Last Pain:  Vitals:   05/27/19 1434  TempSrc:   PainSc: 6                  Cecile Hearing

## 2019-05-27 NOTE — Op Note (Signed)
Preoperative diagnosis: choledocholithiasis s/p ercp, cholelithiasis Postoperative diagnosis: saa Procedure: laparoscopic cholecystectomy Surgeon Dr Harden Mo EBL minimal Anesthesia general  Complications none Drains none Specimens gallbladder and contents to pathology Sponge and needle count correct dispo recovery stable  Indications: 54 y/o F with a PMH HTN, HLD, PTSD, DVT and PE on Eliquis, seizure, and obesity who presented to [facility] with a cc abdominal pain on 05/25/19. Reports acute onset, sharp, upper abdominal pain along with upper back pain. The pain is constant. Denies similar pain in the past. Associated sxs include nausea. Denies vomiting. Reports constipation at baseline, had a very small non-bloody BM this AM. Last dose Eliquis Tuesday 5/18.  She has stones on outside ct scan.  Underwent ercp that was negative except for dilated duct.  We discussed lap chole.   Procedure: After informed consent was obtained the patient was given antibiotics.  SCDs were placed.  She is then placed under general anesthesia without complication.  She was prepped and draped in the standard sterile surgical fashion.  Surgical timeout was then performed.  I filtrated Marcaine below the umbilicus.  I made a vertical incision and identified the fascia.  this was difficult and I elected to proceed with 5 mm optiview in the luq. I made an incision and entered the abdomen without difficulty.  There was no entry injury. I then placed a 12 mm trocar at the umbilicus under direct vision. I placed 3 further 5 mm ports under direct vision without complication. I removed the luq trocar. I retracted the gallbladder cephalad and lateral.   I obtained a good view of the triangle.  I then clipped her cystic duct three times and divided it leaving two clips in place.  The clips travsersed the entire duct and the duct was viable. The dilated CBD was visible.  I then treated the cystic artery in a similar  fashion. The gallbladder was removed from the liver bed. I then placed it in a retrieval sack and removed it from the abdomen.     Hemostasis was obtained. I did place a small piece of surgicel snow in a friable area of the liver.   I then removed my umbilical trocar.   I placed 2  0 vicryl sutures using the suture passer to completely obliterate the defect.  These were then closed with 4-0 Monocryl after removing all the trocars and desufflating the abdomen.  Glue was placed.  She tolerated this well was extubated and transferred to the recovery room in stable condition.

## 2019-05-27 NOTE — Consult Note (Addendum)
[facility] Surgery Consult Note  Dana Kelly 1965-05-23  [id number].    Requesting MD: Jarvis Newcomer, MD Chief Complaint/Reason for Consult: lap chole, choledocholithiasis   HPI:  Dana Kelly is a 54 y/o F with a PMH HTN, HLD, PTSD, DVT and PE on Eliquis, seizure, and obesity who presented to [facility] with a cc abdominal pain on 05/25/19. Reports acute onset, sharp, upper abdominal pain along with upper back pain. The pain is constant. Denies similar pain in the past. Associated sxs include nausea. Denies vomiting. Reports constipation at baseline, had a very small non-bloody BM this AM. Last dose Eliquis Tuesday 5/18. Denies tobacco or drug use. Reports occasional EtOH use but not daily. Employed as a IT consultant.   ROS: Review of Systems  Constitutional: Negative for chills and fever.  HENT: Negative.   Eyes: Negative.   Respiratory: Negative.   Cardiovascular: Negative.   Gastrointestinal: Positive for abdominal pain, constipation and nausea. Negative for blood in stool, diarrhea, melena and vomiting.  Genitourinary: Negative.   Musculoskeletal: Positive for back pain and neck pain. Negative for falls, joint pain and myalgias.  Skin: Negative.   Neurological: Negative.   Endo/Heme/Allergies: Negative.   Psychiatric/Behavioral: Negative.     History reviewed. No pertinent family history.  Past Medical History:  Diagnosis Date  . Anxiety   . History of MRSA infection   . Hypertension   . Seizure (HCC) 2018   2     Past Surgical History:  Procedure Laterality Date  . ABDOMINAL HYSTERECTOMY    . APPENDECTOMY      Social History:  reports that she has never smoked. She has never used smokeless tobacco. She reports current alcohol use. She reports that she does not use drugs.  Allergies:  Allergies  Allergen Reactions  . Dicyclomine Hives  . Peanut-Containing Drug Products Anaphylaxis    Other reaction(s): Anaphylaxis  . Hydromorphone Hcl Other (See Comments)   . Phenytoin Sodium Extended     Decreases blood pressure  . Pregabalin Other (See Comments)    Other reaction(s): Other (See Comments) Amnesiac episodes   . Sulfa Antibiotics   . Hydromorphone Itching    Other reaction(s): Itching  . Meloxicam Nausea And Vomiting    Medications Prior to Admission  Medication Sig Dispense Refill  . amphetamine-dextroamphetamine (ADDERALL) 30 MG tablet Take 1 tablet by mouth daily. 30 tablet 0  . apixaban (ELIQUIS) 5 MG TABS tablet Take 5 mg by mouth 2 (two) times daily.     Marland Kitchen atorvastatin (LIPITOR) 80 MG tablet Take 80 mg by mouth at bedtime.    . clonazePAM (KLONOPIN) 2 MG tablet Take 1 tablet (2 mg total) by mouth 3 (three) times daily. 90 tablet 2  . ergotamine-caffeine (CAFERGOT) 1-100 MG tablet Take 2 tablets by mouth as needed for migraine. Take if headache is caught early enough and won't need the sumatriptan shot. Do not exceed 3 doses in 24 hours.    Marland Kitchen FLUoxetine (PROZAC) 20 MG capsule Take 3 capsules daily. (Patient taking differently: Take 60 mg by mouth daily. ) 90 capsule 2  . furosemide (LASIX) 40 MG tablet Take 40 mg by mouth every other day.     . lisinopril (ZESTRIL) 40 MG tablet Take 40 mg by mouth daily.     . ondansetron (ZOFRAN-ODT) 4 MG disintegrating tablet Take 4 mg by mouth every 8 (eight) hours as needed for nausea or vomiting.     . prazosin (MINIPRESS) 2 MG capsule Take 2 mg by  mouth at bedtime as needed (nightmares).     . prochlorperazine (COMPAZINE) 10 MG tablet Take 10 mg by mouth as needed for nausea or vomiting.     . promethazine (PHENERGAN) 25 MG tablet Take 25 mg by mouth every 6 (six) hours as needed for nausea or vomiting.     . SUMAtriptan 6 MG/0.5ML SOAJ Inject 6 mg into the skin as needed (migraine).     . traZODone (DESYREL) 100 MG tablet Take two to three tablets at bedtime. (Patient taking differently: Take 300 mg by mouth at bedtime. ) 90 tablet 2  . amphetamine-dextroamphetamine (ADDERALL) 30 MG tablet Take  1 tablet by mouth daily. (Patient not taking: Reported on 05/25/2019) 30 tablet 0  . amphetamine-dextroamphetamine (ADDERALL) 30 MG tablet Take 1 tablet by mouth daily. (Patient not taking: Reported on 05/25/2019) 30 tablet 0    Blood pressure (!) 105/59, pulse 74, temperature 98.1 F (36.7 C), temperature source Oral, resp. rate 17, height 5\' 3"  (1.6 m), weight 114.3 kg, SpO2 91 %. Physical Exam: Constitutional: NAD; conversant; no deformities Eyes: Moist conjunctiva; no lid lag; anicteric; PERRL Neck: Trachea midline; no thyromegaly Lungs: Normal respiratory effort; no tactile fremitus CV: RRR; no palpable thrills; no pitting edema GI: Abd soft, obese; TTP epigastrium and RUQ without rebound or guarding MSK: symmetrical; no clubbing/cyanosis Psychiatric: Appropriate affect; alert and oriented x3 Lymphatic: No palpable cervical or axillary lymphadenopathy  Results for orders placed or performed during the hospital encounter of 05/25/19 (from the past 48 hour(s))  HIV Antibody (routine testing w rflx)     Status: None   Collection Time: 05/25/19  7:59 PM  Result Value Ref Range   HIV Screen 4th Generation wRfx Non Reactive Non Reactive    Comment: Performed at [facility], [address] [place], [place]  Comprehensive metabolic panel     Status: Abnormal   Collection Time: 05/26/19  2:27 AM  Result Value Ref Range   Sodium 139 135 - 145 mmol/L   Potassium 4.1 3.5 - 5.1 mmol/L   Chloride 109 98 - 111 mmol/L   CO2 23 22 - 32 mmol/L   Glucose, Bld 132 (H) 70 - 99 mg/dL    Comment: Glucose reference range applies only to samples taken after fasting for at least 8 hours.   BUN 7 6 - 20 mg/dL   Creatinine, Ser 05/28/19 (H) 0.44 - 1.00 mg/dL   Calcium 8.7 (L) 8.9 - 10.3 mg/dL   Total Protein 5.4 (L) 6.5 - 8.1 g/dL   Albumin 3.0 (L) 3.5 - 5.0 g/dL   AST 26 15 - 41 U/L   ALT 25 0 - 44 U/L   Alkaline Phosphatase 37 (L) 38 - 126 U/L   Total Bilirubin 0.7 0.3 - 1.2 mg/dL    GFR calc non Af Amer >60 >60 mL/min   GFR calc Af Amer >60 >60 mL/min   Anion gap 7 5 - 15    Comment: Performed at [facility], [address] [place], [place]  CBC     Status: Abnormal   Collection Time: 05/26/19  2:27 AM  Result Value Ref Range   WBC 4.7 4.0 - 10.5 K/uL   RBC 3.25 (L) 3.87 - 5.11 MIL/uL   Hemoglobin 10.5 (L) 12.0 - 15.0 g/dL   HCT 05/28/19 (L) 01.0 - 27.2 %   MCV 101.5 (H) 80.0 - 100.0 fL   MCH 32.3 26.0 - 34.0 pg   MCHC  31.8 30.0 - 36.0 g/dL   RDW 12.6 11.5 - 15.5 %   Platelets 298 150 - 400 K/uL   nRBC 0.0 0.0 - 0.2 %    Comment: Performed at [facility], [place] [address], [place], [place] [address]  Glucose, capillary     Status: Abnormal   Collection Time: 05/26/19  7:48 AM  Result Value Ref Range   Glucose-Capillary 112 (H) 70 - 99 mg/dL    Comment: Glucose reference range applies only to samples taken after fasting for at least 8 hours.  Glucose, capillary     Status: Abnormal   Collection Time: 05/26/19 11:56 AM  Result Value Ref Range   Glucose-Capillary 119 (H) 70 - 99 mg/dL    Comment: Glucose reference range applies only to samples taken after fasting for at least 8 hours.  Glucose, capillary     Status: Abnormal   Collection Time: 05/26/19  9:19 PM  Result Value Ref Range   Glucose-Capillary 147 (H) 70 - 99 mg/dL    Comment: Glucose reference range applies only to samples taken after fasting for at least 8 hours.  Comprehensive metabolic panel     Status: Abnormal   Collection Time: 05/27/19  7:15 AM  Result Value Ref Range   Sodium 140 135 - 145 mmol/L   Potassium 5.3 (H) 3.5 - 5.1 mmol/L   Chloride 102 98 - 111 mmol/L   CO2 25 22 - 32 mmol/L   Glucose, Bld 130 (H) 70 - 99 mg/dL    Comment: Glucose reference range applies only to samples taken after fasting for at least 8 hours.   BUN 9 6 - 20 mg/dL   Creatinine, Ser 1.19 (H) 0.44 - 1.00 mg/dL   Calcium 9.6 8.9 - 10.3 mg/dL   Total Protein 6.1 (L) 6.5 - 8.1 g/dL    Albumin 3.3 (L) 3.5 - 5.0 g/dL   AST 100 (H) 15 - 41 U/L   ALT 82 (H) 0 - 44 U/L   Alkaline Phosphatase 102 38 - 126 U/L   Total Bilirubin 0.6 0.3 - 1.2 mg/dL   GFR calc non Af Amer 53 (L) >60 mL/min   GFR calc Af Amer >60 >60 mL/min   Anion gap 13 5 - 15    Comment: Performed at [facility] [address] [place], [place] [address]  Glucose, capillary     Status: Abnormal   Collection Time: 05/27/19  8:08 AM  Result Value Ref Range   Glucose-Capillary 122 (H) 70 - 99 mg/dL    Comment: Glucose reference range applies only to samples taken after fasting for at least 8 hours.   DG ERCP BILIARY & PANCREATIC DUCTS  Result Date: 05/26/2019 CLINICAL DATA:  Bile duct stone. EXAM: ERCP TECHNIQUE: Multiple spot images obtained with the fluoroscopic device and submitted for interpretation post-procedure. COMPARISON:  None. FLUOROSCOPY TIME:  4 minutes, 9 seconds FINDINGS: Two spot intraoperative fluoroscopic images of the right upper abdominal quadrant during ERCP are provided for review Initial image demonstrates an ERCP probe overlying the right upper abdominal quadrant. There is selective cannulation opacification of the common bile duct which appears normal to very mildly dilated. There is minimal opacification of the intrahepatic biliary system which appears nondilated. There is minimal opacification of the cystic duct with faint opacification of gallbladder. No definitive opacification of the pancreatic duct. No discrete filling defects are seen within the opacified portions of the biliary system to suggest the presence of choledocholithiasis.  IMPRESSION: ERCP as above. These images were submitted for radiologic interpretation only. Please see the procedural report for the amount of contrast and the fluoroscopy time utilized. Electronically Signed   By: Simonne Come M.D.   On: 05/26/2019 17:34    Assessment/Plan HTN HLD PMH DVT/PE on Eliquis held since Thursday  PMH seizure PTSD Morbid  obesity   Choledocholithiasis - s/p ERCP sphincterotomy by Dr. Christella Hartigan 5/21; CBD dilated but no stones visualized - AST 100, ALT 82, t.bili 0.6, lipase pending  - recommend laparoscopic cholecystectomy this admission while off of anticoagulation.   - negative covid test 5/20 at [facility] confirmed in paper chart  Adam Phenix, [facility] 05/27/2019, 8:39 AM

## 2019-05-28 ENCOUNTER — Encounter (HOSPITAL_COMMUNITY): Payer: Self-pay | Admitting: Surgery

## 2019-05-28 ENCOUNTER — Encounter (HOSPITAL_COMMUNITY): Payer: Self-pay

## 2019-05-28 DIAGNOSIS — R748 Abnormal levels of other serum enzymes: Secondary | ICD-10-CM

## 2019-05-28 LAB — COMPREHENSIVE METABOLIC PANEL
ALT: 56 U/L — ABNORMAL HIGH (ref 0–44)
AST: 40 U/L (ref 15–41)
Albumin: 3 g/dL — ABNORMAL LOW (ref 3.5–5.0)
Alkaline Phosphatase: 69 U/L (ref 38–126)
Anion gap: 9 (ref 5–15)
BUN: 8 mg/dL (ref 6–20)
CO2: 27 mmol/L (ref 22–32)
Calcium: 8.7 mg/dL — ABNORMAL LOW (ref 8.9–10.3)
Chloride: 105 mmol/L (ref 98–111)
Creatinine, Ser: 0.89 mg/dL (ref 0.44–1.00)
GFR calc Af Amer: >60 mL/min (ref >60)
GFR calc non Af Amer: >60 mL/min (ref >60)
Glucose, Bld: 115 mg/dL — ABNORMAL HIGH (ref 70–99)
Potassium: 3.9 mmol/L (ref 3.5–5.1)
Sodium: 141 mmol/L (ref 135–145)
Total Bilirubin: 0.7 mg/dL (ref 0.3–1.2)
Total Protein: 5.5 g/dL — ABNORMAL LOW (ref 6.5–8.1)

## 2019-05-28 LAB — CBC
HCT: 32.9 % — ABNORMAL LOW (ref 36.0–46.0)
Hemoglobin: 10.6 g/dL — ABNORMAL LOW (ref 12.0–15.0)
MCH: 32.2 pg (ref 26.0–34.0)
MCHC: 32.2 g/dL (ref 30.0–36.0)
MCV: 100 fL (ref 80.0–100.0)
Platelets: 297 10*3/uL (ref 150–400)
RBC: 3.29 MIL/uL — ABNORMAL LOW (ref 3.87–5.11)
RDW: 12.7 % (ref 11.5–15.5)
WBC: 8 10*3/uL (ref 4.0–10.5)
nRBC: 0 % (ref 0.0–0.2)

## 2019-05-28 MED ORDER — HYDROCODONE-ACETAMINOPHEN 5-325 MG PO TABS: 1.0000 | ORAL_TABLET | Freq: Four times a day (QID) | ORAL | 0 refills | Status: AC | PRN

## 2019-05-28 MED ORDER — LOPERAMIDE HCL 2 MG PO CAPS: 2.0000 mg | ORAL_CAPSULE | Freq: Four times a day (QID) | ORAL | Status: DC | PRN

## 2019-05-28 NOTE — Discharge Summary (Addendum)
Physician Discharge Summary  Dana Kelly YHC:[id number] DOB: 1965-06-05 DOA: 05/25/2019  PCP: Drema Halon, FNP  Admit date: 05/25/2019 Discharge date: 05/28/2019  Discharge disposition: Home   Recommendations for Outpatient Follow-Up:   Outpatient follow-up with general surgeon in 1 week   Discharge Diagnosis:   Principal Problem:   Choledocholithiasis Active Problems:   Anxiety   Essential hypertension   Hyperlipidemia   Seizure disorder (HCC)   Elevated liver enzymes    Discharge Condition: Stable.  Diet recommendation: Heart healthy diet  Code status: Full code.    Hospital Course:   Dana Kelly a 54 y.o.femalewith a history of pulmonary embolism on Eliquis (per patient this was diagnosed in June 2021 after she developed septic shock) HTN, HLD, migraines, obesity, PTSD, depression and anxiety who presented to [facility] [place] due to abdominal pain, nausea and vomiting on 5/20.  Reportedly, CT scan of the abdomen and pelvis had suggested distal common bile duct stone. She was transferred to [facility] for GI evaluation.  Her liver enzymes were slightly elevated.  She underwent ERCP 05/26/2019.  ERCP showed dilated CBD without obvious filling defects.  Small biliary sphincterotomy and sweeping of the bile duct were performed during ERCP.  There were no stones or purulence noted and there was no evidence of cholecystitis noted during ERCP.  She was seen in consultation by the general surgeon.  She underwent laparoscopic cholecystectomy on 05/27/2019.  Her condition has improved.  She has been able to tolerate a regular diet without any problems.  From surgical standpoint, patient can be discharged home.  Of note, patient blood pressure has been on the low side.  She had she said that her blood pressure had actually been low prior to admission.  Decision was made to discontinue lisinopril at discharge.  Discharge plan was  discussed with the patient and her mother at the bedside.  Medical Consultants:    Gastroenterologist, Dr. Rob Bunting  General surgeon, Dr. Emelia Loron   Discharge Exam:   Vitals:   05/28/19 0213 05/28/19 0510  BP: (!) 97/59 108/63  Pulse: 85 82  Resp: 17 17  Temp: 98 F (36.7 C) 97.9 F (36.6 C)  SpO2: 91% 90%   Vitals:   05/27/19 1900 05/27/19 2058 05/28/19 0213 05/28/19 0510  BP: 136/81 (!) 107/57 (!) 97/59 108/63  Pulse: 68 88 85 82  Resp: 18 17 17 17   Temp: 98 F (36.7 C) 98.2 F (36.8 C) 98 F (36.7 C) 97.9 F (36.6 C)  TempSrc: Oral     SpO2: 100% 92% 91% 90%  Weight:      Height:         GEN: NAD SKIN: No rash EYES: EOMI ENT: MMM CV: RRR PULM: CTA B ABD: soft, obese, mild surgical tenderness, +BS CNS: AAO x 3, non focal EXT: No edema or tenderness   The results of significant diagnostics from this hospitalization (including imaging, microbiology, ancillary and laboratory) are listed below for reference.     Procedures and Diagnostic Studies:   DG ERCP BILIARY & PANCREATIC DUCTS  Result Date: 05/26/2019 CLINICAL DATA:  Bile duct stone. EXAM: ERCP TECHNIQUE: Multiple spot images obtained with the fluoroscopic device and submitted for interpretation post-procedure. COMPARISON:  None. FLUOROSCOPY TIME:  4 minutes, 9 seconds FINDINGS: Two spot intraoperative fluoroscopic images of the right upper abdominal quadrant during ERCP are provided for review Initial image demonstrates an ERCP probe overlying the right upper abdominal quadrant. There is  selective cannulation opacification of the common bile duct which appears normal to very mildly dilated. There is minimal opacification of the intrahepatic biliary system which appears nondilated. There is minimal opacification of the cystic duct with faint opacification of gallbladder. No definitive opacification of the pancreatic duct. No discrete filling defects are seen within the opacified portions of  the biliary system to suggest the presence of choledocholithiasis. IMPRESSION: ERCP as above. These images were submitted for radiologic interpretation only. Please see the procedural report for the amount of contrast and the fluoroscopy time utilized. Electronically Signed   By: Sandi Mariscal M.D.   On: 05/26/2019 17:34     Labs:   Basic Metabolic Panel: Recent Labs  Lab 05/26/19 0227 05/26/19 0227 05/27/19 0715 05/28/19 0549  NA 139  --  140 141  K 4.1   < > 5.3* 3.9  CL 109  --  102 105  CO2 23  --  25 27  GLUCOSE 132*  --  130* 115*  BUN 7  --  9 8  CREATININE 1.01*  --  1.19* 0.89  CALCIUM 8.7*  --  9.6 8.7*   < > = values in this interval not displayed.   GFR Estimated Creatinine Clearance: 91.1 mL/min (by C-G formula based on SCr of 0.89 mg/dL). Liver Function Tests: Recent Labs  Lab 05/26/19 0227 05/27/19 0715 05/28/19 0549  AST 26 100* 40  ALT 25 82* 56*  ALKPHOS 37* 102 69  BILITOT 0.7 0.6 0.7  PROT 5.4* 6.1* 5.5*  ALBUMIN 3.0* 3.3* 3.0*   Recent Labs  Lab 05/27/19 1330  LIPASE 564*   No results for input(s): AMMONIA in the last 168 hours. Coagulation profile No results for input(s): INR, PROTIME in the last 168 hours.  CBC: Recent Labs  Lab 05/26/19 0227 05/27/19 1330 05/28/19 0549  WBC 4.7 17.6* 8.0  HGB 10.5* 12.3 10.6*  HCT 33.0* 36.4 32.9*  MCV 101.5* 96.6 100.0  PLT 298 330 297   Cardiac Enzymes: No results for input(s): CKTOTAL, CKMB, CKMBINDEX, TROPONINI in the last 168 hours. BNP: Invalid input(s): POCBNP CBG: Recent Labs  Lab 05/26/19 0748 05/26/19 1156 05/26/19 2119 05/27/19 0808 05/27/19 1225  GLUCAP 112* 119* 147* 122* 139*   D-Dimer No results for input(s): DDIMER in the last 72 hours. Hgb A1c No results for input(s): HGBA1C in the last 72 hours. Lipid Profile No results for input(s): CHOL, HDL, LDLCALC, TRIG, CHOLHDL, LDLDIRECT in the last 72 hours. Thyroid function studies No results for input(s): TSH, T4TOTAL,  T3FREE, THYROIDAB in the last 72 hours.  Invalid input(s): FREET3 Anemia work up Recent Labs    05/27/19 0715  VITAMINB12 546  FOLATE 6.4   Microbiology No results found for this or any previous visit (from the past 240 hour(s)).   Discharge Instructions:   Discharge Instructions    Call MD for:  difficulty breathing, headache or visual disturbances   Complete by: As directed    Call MD for:  extreme fatigue   Complete by: As directed    Call MD for:  hives   Complete by: As directed    Call MD for:  persistant dizziness or light-headedness   Complete by: As directed    Call MD for:  persistant nausea and vomiting   Complete by: As directed    Call MD for:  redness, tenderness, or signs of infection (pain, swelling, redness, odor or green/yellow discharge around incision site)   Complete by: As directed  Call MD for:  severe uncontrolled pain   Complete by: As directed    Call MD for:  temperature >100.4   Complete by: As directed    Diet - low sodium heart healthy   Complete by: As directed    Discharge instructions   Complete by: As directed    Low fat diet. No heavy lifting. May shower   Increase activity slowly   Complete by: As directed    No wound care   Complete by: As directed      Allergies as of 05/28/2019      Reactions   Dicyclomine Hives   Peanut-containing Drug Products Anaphylaxis   Other reaction(s): Anaphylaxis   Hydromorphone Hcl Other (See Comments)   Phenytoin Sodium Extended    Decreases blood pressure   Pregabalin Other (See Comments)   Other reaction(s): Other (See Comments) Amnesiac episodes   Sulfa Antibiotics    Hydromorphone Itching   Other reaction(s): Itching   Meloxicam Nausea And Vomiting      Medication List    STOP taking these medications   lisinopril 40 MG tablet Commonly known as: ZESTRIL     TAKE these medications   amphetamine-dextroamphetamine 30 MG tablet Commonly known as: Adderall Take 1 tablet by mouth  daily. What changed: Another medication with the same name was removed. Continue taking this medication, and follow the directions you see here.   atorvastatin 80 MG tablet Commonly known as: LIPITOR Take 80 mg by mouth at bedtime.   clonazePAM 2 MG tablet Commonly known as: KLONOPIN Take 1 tablet (2 mg total) by mouth 3 (three) times daily.   Eliquis 5 MG Tabs tablet Generic drug: apixaban Take 5 mg by mouth 2 (two) times daily.   ergotamine-caffeine 1-100 MG tablet Commonly known as: CAFERGOT Take 2 tablets by mouth as needed for migraine. Take if headache is caught early enough and won't need the sumatriptan shot. Do not exceed 3 doses in 24 hours.   FLUoxetine 20 MG capsule Commonly known as: PROZAC Take 3 capsules daily. What changed:   how much to take  how to take this  when to take this  additional instructions   furosemide 40 MG tablet Commonly known as: LASIX Take 40 mg by mouth every other day.   HYDROcodone-acetaminophen 5-325 MG tablet Commonly known as: NORCO/VICODIN Take 1-2 tablets by mouth every 6 (six) hours as needed for moderate pain or severe pain.   ondansetron 4 MG disintegrating tablet Commonly known as: ZOFRAN-ODT Take 4 mg by mouth every 8 (eight) hours as needed for nausea or vomiting.   prazosin 2 MG capsule Commonly known as: MINIPRESS Take 2 mg by mouth at bedtime as needed (nightmares).   prochlorperazine 10 MG tablet Commonly known as: COMPAZINE Take 10 mg by mouth as needed for nausea or vomiting.   promethazine 25 MG tablet Commonly known as: PHENERGAN Take 25 mg by mouth every 6 (six) hours as needed for nausea or vomiting.   SUMAtriptan 6 MG/0.5ML Soaj Inject 6 mg into the skin as needed (migraine).   traZODone 100 MG tablet Commonly known as: DESYREL Take two to three tablets at bedtime. What changed:   how much to take  how to take this  when to take this  additional instructions      Follow-up Information     [facility] Follow up.   Specialty: General Surgery Why: call to confirm post-operative appointment date/time. Contact information: [address]  [place] [address] [phone]           Time coordinating discharge: 32 minutes  Signed:  Lurene Shadow  [facility] 05/28/2019, 5:13 PM

## 2019-05-28 NOTE — Progress Notes (Signed)
1 Day Post-Op   Subjective/Chief Complaint: Complains of some soreness and nausea overnight   Objective: Vital signs in last 24 hours: Temp:  [97.8 F (36.6 C)-98.5 F (36.9 C)] 97.9 F (36.6 C) (05/23 0510) Pulse Rate:  [68-88] 82 (05/23 0510) Resp:  [14-22] 17 (05/23 0510) BP: (97-147)/(57-123) 108/63 (05/23 0510) SpO2:  [90 %-100 %] 90 % (05/23 0510) Last BM Date: 05/22/19  Intake/Output from previous day: 05/22 0701 - 05/23 0700 In: 1520 [P.O.:820; I.V.:600; IV Piggyback:100] Out: 10 [Blood:10] Intake/Output this shift: No intake/output data recorded.  General appearance: alert and cooperative Resp: clear to auscultation bilaterally Cardio: regular rate and rhythm GI: soft mild tenderness  Lab Results:  Recent Labs    05/26/19 0227 05/27/19 1330  WBC 4.7 17.6*  HGB 10.5* 12.3  HCT 33.0* 36.4  PLT 298 330   BMET Recent Labs    05/26/19 0227 05/27/19 0715  NA 139 140  K 4.1 5.3*  CL 109 102  CO2 23 25  GLUCOSE 132* 130*  BUN 7 9  CREATININE 1.01* 1.19*  CALCIUM 8.7* 9.6   PT/INR No results for input(s): LABPROT, INR in the last 72 hours. ABG No results for input(s): PHART, HCO3 in the last 72 hours.  Invalid input(s): PCO2, PO2  Studies/Results: DG ERCP BILIARY & PANCREATIC DUCTS  Result Date: 05/26/2019 CLINICAL DATA:  Bile duct stone. EXAM: ERCP TECHNIQUE: Multiple spot images obtained with the fluoroscopic device and submitted for interpretation post-procedure. COMPARISON:  None. FLUOROSCOPY TIME:  4 minutes, 9 seconds FINDINGS: Two spot intraoperative fluoroscopic images of the right upper abdominal quadrant during ERCP are provided for review Initial image demonstrates an ERCP probe overlying the right upper abdominal quadrant. There is selective cannulation opacification of the common bile duct which appears normal to very mildly dilated. There is minimal opacification of the intrahepatic biliary system which appears nondilated. There is minimal  opacification of the cystic duct with faint opacification of gallbladder. No definitive opacification of the pancreatic duct. No discrete filling defects are seen within the opacified portions of the biliary system to suggest the presence of choledocholithiasis. IMPRESSION: ERCP as above. These images were submitted for radiologic interpretation only. Please see the procedural report for the amount of contrast and the fluoroscopy time utilized. Electronically Signed   By: Simonne Come M.D.   On: 05/26/2019 17:34    Anti-infectives: Anti-infectives (From admission, onward)   Start     Dose/Rate Route Frequency Ordered Stop   05/27/19 0956  ceFAZolin (ANCEF) 2-4 GM/100ML-% IVPB    Note to Pharmacy: Myra Rude   : cabinet override      05/27/19 0956 05/27/19 1019   05/27/19 0930  ceFAZolin (ANCEF) IVPB 2g/100 mL premix     2 g 200 mL/hr over 30 Minutes Intravenous On call 05/27/19 0834 05/27/19 1012   05/26/19 1400  meropenem (MERREM) 1 g in sodium chloride 0.9 % 100 mL IVPB  Status:  Discontinued     1 g 200 mL/hr over 30 Minutes Intravenous Every 8 hours 05/26/19 1250 05/26/19 2330   05/26/19 0600  meropenem (MERREM) 500 mg in sodium chloride 0.9 % 100 mL IVPB  Status:  Discontinued     500 mg 200 mL/hr over 30 Minutes Intravenous Every 8 hours 05/25/19 1906 05/26/19 1250   05/25/19 1915  meropenem (MERREM) 500 mg in sodium chloride 0.9 % 100 mL IVPB     500 mg 200 mL/hr over 30 Minutes Intravenous  Once 05/25/19 1915 05/25/19 2059  Assessment/Plan: s/p Procedure(s): LAPAROSCOPIC CHOLECYSTECTOMY (N/A) Advance diet  Plan for d/c later today if she tolerates diet  LOS: 3 days    Autumn Messing III 05/28/2019

## 2019-05-28 NOTE — Progress Notes (Signed)
Patient discharged to home. Verbalizes understanding of all discharge instructions including incision care, discharge medications, and follow up MD visits. Patient's mother at bedside for review of AVS.

## 2019-05-30 LAB — SURGICAL PATHOLOGY

## 2019-07-26 ENCOUNTER — Telehealth (INDEPENDENT_AMBULATORY_CARE_PROVIDER_SITE_OTHER): Payer: PRIVATE HEALTH INSURANCE | Admitting: Adult Health

## 2019-07-26 DIAGNOSIS — F909 Attention-deficit hyperactivity disorder, unspecified type: Secondary | ICD-10-CM

## 2019-07-26 DIAGNOSIS — G4700 Insomnia, unspecified: Secondary | ICD-10-CM

## 2019-07-26 DIAGNOSIS — F4310 Post-traumatic stress disorder, unspecified: Secondary | ICD-10-CM | POA: Diagnosis not present

## 2019-07-26 DIAGNOSIS — F331 Major depressive disorder, recurrent, moderate: Secondary | ICD-10-CM

## 2019-07-26 DIAGNOSIS — F411 Generalized anxiety disorder: Secondary | ICD-10-CM

## 2019-07-26 DIAGNOSIS — F410 Panic disorder [episodic paroxysmal anxiety] without agoraphobia: Secondary | ICD-10-CM

## 2019-07-26 MED ORDER — AMPHETAMINE-DEXTROAMPHETAMINE 30 MG PO TABS: 30.0000 mg | ORAL_TABLET | Freq: Every day | ORAL | 0 refills | Status: DC

## 2019-07-26 MED ORDER — FLUOXETINE HCL 40 MG PO CAPS: 40.0000 mg | ORAL_CAPSULE | Freq: Two times a day (BID) | ORAL | 5 refills | Status: DC

## 2019-07-26 MED ORDER — QUETIAPINE FUMARATE 50 MG PO TABS: ORAL_TABLET | ORAL | 2 refills | Status: DC

## 2019-07-26 MED ORDER — CLONAZEPAM 2 MG PO TABS: 2.0000 mg | ORAL_TABLET | Freq: Three times a day (TID) | ORAL | 2 refills | Status: DC

## 2019-07-26 NOTE — Progress Notes (Signed)
Dana LeveringKimberly L Lawn [id number] 06/24/1965 54 y.o.  Virtual Visit via Telephone Note  I connected with pt on 07/26/19 at  5:00 PM EDT by telephone and verified that I am speaking with the correct person using two identifiers.   I discussed the limitations, risks, security and privacy concerns of performing an evaluation and management service by telephone and the availability of in person appointments. I also discussed with the patient that there may be a patient responsible charge related to this service. The patient expressed understanding and agreed to proceed.   I discussed the assessment and treatment plan with the patient. The patient was provided an opportunity to ask questions and all were answered. The patient agreed with the plan and demonstrated an understanding of the instructions.   The patient was advised to call back or seek an in-person evaluation if the symptoms worsen or if the condition fails to improve as anticipated.  I provided 30 minutes of non-face-to-face time during this encounter.  The patient was located at home.  The provider was located at [facility].   Dorothyann Gibbsegina N Indiyah Paone, NP   Subjective:   Patient ID:  Dana Kelly is a 54 y.o. (DOB 04/28/1965) female.  Chief Complaint: No chief complaint on file.   HPI Dana LeveringKimberly L Burbage presents for follow-up of MDD, GAD, insomnia, panic attacks, ADHD, and PTSD.    Describes mood today as "so-so". Pleasant. Decreased tearfulness. Mood symptoms - reports  depression, anxiety, and irritability. More anxious overall - back and legs cramping. Stating "I am doing better". PCP said she could restart ADHD medication as long as she continues the Lisinopril. Has recovered from Gallbladder surgery. Returned to work - she and boss doing "better". He was frustrated with her absences. Has not been out since coming back from surgery. Spending time with mother. Offering to volunteer and be more active in community. Improved  interest and motivation. Taking medications as prescribed.  Energy levels low. Active, does not have a regular exercise routine. Walking some days.  Enjoys some usual interests and activities. Single. Lives alone. Stays with mother some.  Appetite adequate. Weight stable. Sleeping difficulties. Averages 4 to 6 hours broken sleep. Stopped Trazadone - wasn't working for her. Denies daytime napping. Focus and concentration difficulties. Completing tasks. Managing aspects of household. Works full-time - IT consultantaralegal x 16 years. Denies SI or HI. Denies AH or VH.   Review of Systems:  Review of Systems  Musculoskeletal: Negative for gait problem.  Neurological: Negative for tremors.  Psychiatric/Behavioral:       Please refer to HPI    Medications: I have reviewed the patient's current medications.  Current Outpatient Medications  Medication Sig Dispense Refill  . amphetamine-dextroamphetamine (ADDERALL) 30 MG tablet Take 1 tablet by mouth daily. 30 tablet 0  . apixaban (ELIQUIS) 5 MG TABS tablet Take 5 mg by mouth 2 (two) times daily.     Marland Kitchen. atorvastatin (LIPITOR) 80 MG tablet Take 80 mg by mouth at bedtime.    . clonazePAM (KLONOPIN) 2 MG tablet Take 1 tablet (2 mg total) by mouth 3 (three) times daily. 90 tablet 2  . ergotamine-caffeine (CAFERGOT) 1-100 MG tablet Take 2 tablets by mouth as needed for migraine. Take if headache is caught early enough and won't need the sumatriptan shot. Do not exceed 3 doses in 24 hours.    Marland Kitchen. FLUoxetine (PROZAC) 40 MG capsule Take 1 capsule (40 mg total) by mouth 2 (two) times daily. 60 capsule 5  . furosemide (  LASIX) 40 MG tablet Take 40 mg by mouth every other day.     Marland Kitchen HYDROcodone-acetaminophen (NORCO/VICODIN) 5-325 MG tablet Take 1-2 tablets by mouth every 6 (six) hours as needed for moderate pain or severe pain. 10 tablet 0  . ondansetron (ZOFRAN-ODT) 4 MG disintegrating tablet Take 4 mg by mouth every 8 (eight) hours as needed for nausea or vomiting.      . prazosin (MINIPRESS) 2 MG capsule Take 2 mg by mouth at bedtime as needed (nightmares).     . prochlorperazine (COMPAZINE) 10 MG tablet Take 10 mg by mouth as needed for nausea or vomiting.     . promethazine (PHENERGAN) 25 MG tablet Take 25 mg by mouth every 6 (six) hours as needed for nausea or vomiting.     Marland Kitchen QUEtiapine (SEROQUEL) 50 MG tablet Take one to two tablets at bedtime. 60 tablet 2  . SUMAtriptan 6 MG/0.5ML SOAJ Inject 6 mg into the skin as needed (migraine).      No current facility-administered medications for this visit.    Medication Side Effects: None  Allergies:  Allergies  Allergen Reactions  . Dicyclomine Hives  . Peanut-Containing Drug Products Anaphylaxis    Other reaction(s): Anaphylaxis  . Hydromorphone Hcl Other (See Comments)  . Phenytoin Sodium Extended     Decreases blood pressure  . Pregabalin Other (See Comments)    Other reaction(s): Other (See Comments) Amnesiac episodes   . Sulfa Antibiotics   . Hydromorphone Itching    Other reaction(s): Itching  . Meloxicam Nausea And Vomiting    Past Medical History:  Diagnosis Date  . Anxiety   . History of MRSA infection   . Hypertension   . Seizure (HCC) 2018   2     No family history on file.  Social History   Socioeconomic History  . Marital status: Divorced    Spouse name: Not on file  . Number of children: Not on file  . Years of education: Not on file  . Highest education level: Not on file  Occupational History  . Not on file  Tobacco Use  . Smoking status: Never Smoker  . Smokeless tobacco: Never Used  Vaping Use  . Vaping Use: Never used  Substance and Sexual Activity  . Alcohol use: Yes    Comment: occasional  . Drug use: Never  . Sexual activity: Not on file  Other Topics Concern  . Not on file  Social History Narrative  . Not on file   Social Determinants of Health   Financial Resource Strain:   . Difficulty of Paying Living Expenses:   Food Insecurity:   .  Worried About Programme researcher, broadcasting/film/video in the Last Year:   . Barista in the Last Year:   Transportation Needs:   . Freight forwarder (Medical):   Marland Kitchen Lack of Transportation (Non-Medical):   Physical Activity:   . Days of Exercise per Week:   . Minutes of Exercise per Session:   Stress:   . Feeling of Stress :   Social Connections:   . Frequency of Communication with Friends and Family:   . Frequency of Social Gatherings with Friends and Family:   . Attends Religious Services:   . Active Member of Clubs or Organizations:   . Attends Banker Meetings:   Marland Kitchen Marital Status:   Intimate Partner Violence:   . Fear of Current or Ex-Partner:   . Emotionally Abused:   Marland Kitchen Physically Abused:   .  Sexually Abused:     Past Medical History, Surgical history, Social history, and Family history were reviewed and updated as appropriate.   Please see review of systems for further details on the patient's review from today.   Objective:   Physical Exam:  There were no vitals taken for this visit.  Physical Exam Constitutional:      General: She is not in acute distress. Musculoskeletal:        General: No deformity.  Neurological:     Mental Status: She is alert and oriented to person, place, and time.     Coordination: Coordination normal.  Psychiatric:        Attention and Perception: Attention and perception normal. She does not perceive auditory or visual hallucinations.        Mood and Affect: Mood is anxious. Mood is not depressed. Affect is not labile, blunt, angry or inappropriate.        Speech: Speech normal.        Behavior: Behavior normal.        Thought Content: Thought content normal. Thought content is not paranoid or delusional. Thought content does not include homicidal or suicidal ideation. Thought content does not include homicidal or suicidal plan.        Cognition and Memory: Cognition and memory normal.        Judgment: Judgment normal.      Comments: Insight intact     Lab Review:     Component Value Date/Time   NA 141 05/28/2019 0549   K 3.9 05/28/2019 0549   CL 105 05/28/2019 0549   CO2 27 05/28/2019 0549   GLUCOSE 115 (H) 05/28/2019 0549   BUN 8 05/28/2019 0549   CREATININE 0.89 05/28/2019 0549   CALCIUM 8.7 (L) 05/28/2019 0549   PROT 5.5 (L) 05/28/2019 0549   ALBUMIN 3.0 (L) 05/28/2019 0549   AST 40 05/28/2019 0549   ALT 56 (H) 05/28/2019 0549   ALKPHOS 69 05/28/2019 0549   BILITOT 0.7 05/28/2019 0549   GFRNONAA >60 05/28/2019 0549   GFRAA >60 05/28/2019 0549       Component Value Date/Time   WBC 8.0 05/28/2019 0549   RBC 3.29 (L) 05/28/2019 0549   HGB 10.6 (L) 05/28/2019 0549   HCT 32.9 (L) 05/28/2019 0549   PLT 297 05/28/2019 0549   MCV 100.0 05/28/2019 0549   MCH 32.2 05/28/2019 0549   MCHC 32.2 05/28/2019 0549   RDW 12.7 05/28/2019 0549    No results found for: POCLITH, LITHIUM   No results found for: PHENYTOIN, PHENOBARB, VALPROATE, CBMZ   .res Assessment: Plan:    Plan:  Clonazepam 2mg  TID  Increase Prozac 60mg  to 80mg   Restarted Adderall 30mg  daily - started 1 week ago  Seroquel 50mg  - 1 to 2 at bedtime  BP 110/75  RTC 4 weeks  Patient advised to contact office with any questions, adverse effects, or acute worsening in signs and symptoms.  Discussed potential benefits, risk, and side effects of benzodiazepines to include potential risk of tolerance and dependence, as well as possible drowsiness.  Advised patient not to drive if experiencing drowsiness and to take lowest possible effective dose to minimize risk of dependence and tolerance.  Discussed potential metabolic side effects associated with atypical antipsychotics, as well as potential risk for movement side effects. Advised pt to contact office if movement side effects occur.     Diagnoses and all orders for this visit:  Attention deficit hyperactivity disorder (ADHD), unspecified ADHD  type -      amphetamine-dextroamphetamine (ADDERALL) 30 MG tablet; Take 1 tablet by mouth daily.  PTSD (post-traumatic stress disorder) -     clonazePAM (KLONOPIN) 2 MG tablet; Take 1 tablet (2 mg total) by mouth 3 (three) times daily.  Panic attacks -     clonazePAM (KLONOPIN) 2 MG tablet; Take 1 tablet (2 mg total) by mouth 3 (three) times daily.  Insomnia, unspecified type -     clonazePAM (KLONOPIN) 2 MG tablet; Take 1 tablet (2 mg total) by mouth 3 (three) times daily. -     QUEtiapine (SEROQUEL) 50 MG tablet; Take one to two tablets at bedtime.  Major depressive disorder, recurrent episode, moderate (HCC) -     FLUoxetine (PROZAC) 40 MG capsule; Take 1 capsule (40 mg total) by mouth 2 (two) times daily. -     QUEtiapine (SEROQUEL) 50 MG tablet; Take one to two tablets at bedtime.  Generalized anxiety disorder -     FLUoxetine (PROZAC) 40 MG capsule; Take 1 capsule (40 mg total) by mouth 2 (two) times daily.    Please see After Visit Summary for patient specific instructions.  No future appointments.  No orders of the defined types were placed in this encounter.     -------------------------------

## 2019-09-22 ENCOUNTER — Other Ambulatory Visit: Payer: Self-pay | Admitting: Adult Health

## 2019-09-22 DIAGNOSIS — F331 Major depressive disorder, recurrent, moderate: Secondary | ICD-10-CM

## 2019-09-22 DIAGNOSIS — F411 Generalized anxiety disorder: Secondary | ICD-10-CM

## 2019-09-22 DIAGNOSIS — F4310 Post-traumatic stress disorder, unspecified: Secondary | ICD-10-CM

## 2019-09-25 ENCOUNTER — Ambulatory Visit (INDEPENDENT_AMBULATORY_CARE_PROVIDER_SITE_OTHER): Payer: PRIVATE HEALTH INSURANCE | Admitting: Adult Health

## 2019-09-25 ENCOUNTER — Encounter: Payer: Self-pay | Admitting: Adult Health

## 2019-09-25 ENCOUNTER — Other Ambulatory Visit: Payer: Self-pay

## 2019-09-25 VITALS — BP 103/80

## 2019-09-25 DIAGNOSIS — G4700 Insomnia, unspecified: Secondary | ICD-10-CM | POA: Diagnosis not present

## 2019-09-25 DIAGNOSIS — F410 Panic disorder [episodic paroxysmal anxiety] without agoraphobia: Secondary | ICD-10-CM | POA: Diagnosis not present

## 2019-09-25 DIAGNOSIS — F4310 Post-traumatic stress disorder, unspecified: Secondary | ICD-10-CM | POA: Diagnosis not present

## 2019-09-25 DIAGNOSIS — F909 Attention-deficit hyperactivity disorder, unspecified type: Secondary | ICD-10-CM | POA: Diagnosis not present

## 2019-09-25 DIAGNOSIS — F331 Major depressive disorder, recurrent, moderate: Secondary | ICD-10-CM

## 2019-09-25 DIAGNOSIS — F411 Generalized anxiety disorder: Secondary | ICD-10-CM

## 2019-09-25 MED ORDER — AMPHETAMINE-DEXTROAMPHETAMINE 30 MG PO TABS: 30.0000 mg | ORAL_TABLET | Freq: Every day | ORAL | 0 refills | Status: DC

## 2019-09-25 MED ORDER — TRAZODONE HCL 100 MG PO TABS: ORAL_TABLET | ORAL | 2 refills | Status: DC

## 2019-09-25 NOTE — Progress Notes (Signed)
Dana Kelly [id number] 1965/09/03 54 y.o.  Subjective:   Patient ID:  Dana Kelly is a 54 y.o. (DOB 12/30/1965) female.  Chief Complaint: No chief complaint on file.   HPI Dana Kelly presents to the office today for follow-up of MDD, GAD, insomnia, panic attacks, ADHD, and PTSD.  Describes mood today as "nervous". Pleasant. Reports tearfulness at times. Mood symptoms - reports decreased depression, anxiety, and irritability - "once in a while". Is more worried and scared about potentially losing her job. Has made a few errors at work and is on probation until February. Reports muscle spasms. Improved interest and motivation. Has started back to church. Taking medications as prescribed.  Energy levels low. Active, does not have a regular exercise routine. Walking.  Enjoys some usual interests and activities. Single. Lives alone. Stays with mother some - having some difficulties.  Appetite decreased. Weight loss - 30 pounds. Sleeping difficulties. Averages 3 to 4 hours broken sleep. Did not tolerate Seroquel. Would like to restart Trazadone.  wasn't working for her. Denies daytime napping. Focus and concentration stable with Adderall. Completing tasks. Managing aspects of household. 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 Denies AH or VH.    Review of Systems:  Review of Systems  Musculoskeletal: Negative for gait problem.  Neurological: Negative for tremors.  Psychiatric/Behavioral:       Please refer to HPI    Medications: I have reviewed the patient's current medications.  Current Outpatient Medications  Medication Sig Dispense Refill  . amphetamine-dextroamphetamine (ADDERALL) 30 MG tablet Take 1 tablet by mouth daily. 30 tablet 0  . apixaban (ELIQUIS) 5 MG TABS tablet Take 5 mg by mouth 2 (two) times daily.     Marland Kitchen atorvastatin (LIPITOR) 80 MG tablet Take 80 mg by mouth at bedtime.    . clonazePAM (KLONOPIN) 2 MG tablet Take 1 tablet (2 mg  total) by mouth 3 (three) times daily. 90 tablet 2  . ergotamine-caffeine (CAFERGOT) 1-100 MG tablet Take 2 tablets by mouth as needed for migraine. Take if headache is caught early enough and won't need the sumatriptan shot. Do not exceed 3 doses in 24 hours.    Marland Kitchen FLUoxetine (PROZAC) 40 MG capsule Take 1 capsule (40 mg total) by mouth 2 (two) times daily. 60 capsule 5  . furosemide (LASIX) 40 MG tablet Take 40 mg by mouth every other day.     Marland Kitchen HYDROcodone-acetaminophen (NORCO/VICODIN) 5-325 MG tablet Take 1-2 tablets by mouth every 6 (six) hours as needed for moderate pain or severe pain. 10 tablet 0  . ondansetron (ZOFRAN-ODT) 4 MG disintegrating tablet Take 4 mg by mouth every 8 (eight) hours as needed for nausea or vomiting.     . prazosin (MINIPRESS) 2 MG capsule Take 2 mg by mouth at bedtime as needed (nightmares).     . prochlorperazine (COMPAZINE) 10 MG tablet Take 10 mg by mouth as needed for nausea or vomiting.     . promethazine (PHENERGAN) 25 MG tablet Take 25 mg by mouth every 6 (six) hours as needed for nausea or vomiting.     . SUMAtriptan 6 MG/0.5ML SOAJ Inject 6 mg into the skin as needed (migraine).     . traZODone (DESYREL) 100 MG tablet Take one to three tablets at bedtime. 90 tablet 2   No current facility-administered medications for this visit.    Medication Side Effects: None  Allergies:  Allergies  Allergen Reactions  . Dicyclomine Hives  .  Peanut-Containing Drug Products Anaphylaxis    Other reaction(s): Anaphylaxis  . Hydromorphone Hcl Other (See Comments)  . Phenytoin Sodium Extended     Decreases blood pressure  . Pregabalin Other (See Comments)    Other reaction(s): Other (See Comments) Amnesiac episodes   . Sulfa Antibiotics   . Hydromorphone Itching    Other reaction(s): Itching  . Meloxicam Nausea And Vomiting    Past Medical History:  Diagnosis Date  . Anxiety   . History of MRSA infection   . Hypertension   . Seizure (HCC) 2018   2      No family history on file.  Social History   Socioeconomic History  . Marital status: Divorced    Spouse name: Not on file  . Number of children: Not on file  . Years of education: Not on file  . Highest education level: Not on file  Occupational History  . Not on file  Tobacco Use  . Smoking status: Never Smoker  . Smokeless tobacco: Never Used  Vaping Use  . Vaping Use: Never used  Substance and Sexual Activity  . Alcohol use: Yes    Comment: occasional  . Drug use: Never  . Sexual activity: Not on file  Other Topics Concern  . Not on file  Social History Narrative  . Not on file   Social Determinants of Health   Financial Resource Strain:   . Difficulty of Paying Living Expenses: Not on file  Food Insecurity:   . Worried About Programme researcher, broadcasting/film/video in the Last Year: Not on file  . Ran Out of Food in the Last Year: Not on file  Transportation Needs:   . Lack of Transportation (Medical): Not on file  . Lack of Transportation (Non-Medical): Not on file  Physical Activity:   . Days of Exercise per Week: Not on file  . Minutes of Exercise per Session: Not on file  Stress:   . Feeling of Stress : Not on file  Social Connections:   . Frequency of Communication with Friends and Family: Not on file  . Frequency of Social Gatherings with Friends and Family: Not on file  . Attends Religious Services: Not on file  . Active Member of Clubs or Organizations: Not on file  . Attends Banker Meetings: Not on file  . Marital Status: Not on file  Intimate Partner Violence:   . Fear of Current or Ex-Partner: Not on file  . Emotionally Abused: Not on file  . Physically Abused: Not on file  . Sexually Abused: Not on file    Past Medical History, Surgical history, Social history, and Family history were reviewed and updated as appropriate.   Please see review of systems for further details on the patient's review from today.   Objective:   Physical Exam:   BP 103/80   Physical Exam Constitutional:      General: She is not in acute distress. Musculoskeletal:        General: No deformity.  Neurological:     Mental Status: She is alert and oriented to person, place, and time.     Coordination: Coordination normal.  Psychiatric:        Attention and Perception: Attention and perception normal. She does not perceive auditory or visual hallucinations.        Mood and Affect: Mood normal. Mood is not anxious or depressed. Affect is not labile, blunt, angry or inappropriate.        Speech:  Speech normal.        Behavior: Behavior normal.        Thought Content: Thought content normal. Thought content is not paranoid or delusional. Thought content does not include homicidal or suicidal ideation. Thought content does not include homicidal or suicidal plan.        Cognition and Memory: Cognition and memory normal.        Judgment: Judgment normal.     Comments: Insight intact     Lab Review:     Component Value Date/Time   NA 141 05/28/2019 0549   K 3.9 05/28/2019 0549   CL 105 05/28/2019 0549   CO2 27 05/28/2019 0549   GLUCOSE 115 (H) 05/28/2019 0549   BUN 8 05/28/2019 0549   CREATININE 0.89 05/28/2019 0549   CALCIUM 8.7 (L) 05/28/2019 0549   PROT 5.5 (L) 05/28/2019 0549   ALBUMIN 3.0 (L) 05/28/2019 0549   AST 40 05/28/2019 0549   ALT 56 (H) 05/28/2019 0549   ALKPHOS 69 05/28/2019 0549   BILITOT 0.7 05/28/2019 0549   GFRNONAA >60 05/28/2019 0549   GFRAA >60 05/28/2019 0549       Component Value Date/Time   WBC 8.0 05/28/2019 0549   RBC 3.29 (L) 05/28/2019 0549   HGB 10.6 (L) 05/28/2019 0549   HCT 32.9 (L) 05/28/2019 0549   PLT 297 05/28/2019 0549   MCV 100.0 05/28/2019 0549   MCH 32.2 05/28/2019 0549   MCHC 32.2 05/28/2019 0549   RDW 12.7 05/28/2019 0549    No results found for: POCLITH, LITHIUM   No results found for: PHENYTOIN, PHENOBARB, VALPROATE, CBMZ   .res Assessment: Plan:    Plan:  Clonazepam 2mg  TID   Prozac 40mg  - 2 daily Adderall 30mg  daily  Trazadone 100mg  - 1 to 3 at bedtime  BP 103/80  RTC 2 months   Patient advised to contact office with any questions, adverse effects, or acute worsening in signs and symptoms.  Discussed potential benefits, risk, and side effects of benzodiazepines to include potential risk of tolerance and dependence, as well as possible drowsiness.  Advised patient not to drive if experiencing drowsiness and to take lowest possible effective dose to minimize risk of dependence and tolerance.  Discussed potential metabolic side effects associated with atypical antipsychotics, as well as potential risk for movement side effects. Advised pt to contact office if movement side effects occur.    Diagnoses and all orders for this visit:  PTSD (post-traumatic stress disorder)  Attention deficit hyperactivity disorder (ADHD), unspecified ADHD type -     amphetamine-dextroamphetamine (ADDERALL) 30 MG tablet; Take 1 tablet by mouth daily.  Panic attacks  Insomnia, unspecified type -     traZODone (DESYREL) 100 MG tablet; Take one to three tablets at bedtime.  Major depressive disorder, recurrent episode, moderate (HCC)  Generalized anxiety disorder     Please see After Visit Summary for patient specific instructions.  Future Appointments  Date Time Provider Department Center  12/19/2019  5:20 PM Ayari Liwanag, , NP [facility] None    No orders of the defined types were placed in this encounter.   -------------------------------

## 2019-11-13 ENCOUNTER — Other Ambulatory Visit: Payer: Self-pay | Admitting: Adult Health

## 2019-11-13 DIAGNOSIS — F909 Attention-deficit hyperactivity disorder, unspecified type: Secondary | ICD-10-CM

## 2019-12-18 ENCOUNTER — Other Ambulatory Visit: Payer: Self-pay | Admitting: Adult Health

## 2019-12-18 DIAGNOSIS — F909 Attention-deficit hyperactivity disorder, unspecified type: Secondary | ICD-10-CM

## 2019-12-19 ENCOUNTER — Encounter: Payer: Self-pay | Admitting: Adult Health

## 2019-12-19 ENCOUNTER — Telehealth (INDEPENDENT_AMBULATORY_CARE_PROVIDER_SITE_OTHER): Payer: PRIVATE HEALTH INSURANCE | Admitting: Adult Health

## 2019-12-19 DIAGNOSIS — F4310 Post-traumatic stress disorder, unspecified: Secondary | ICD-10-CM | POA: Diagnosis not present

## 2019-12-19 DIAGNOSIS — F331 Major depressive disorder, recurrent, moderate: Secondary | ICD-10-CM | POA: Diagnosis not present

## 2019-12-19 DIAGNOSIS — F410 Panic disorder [episodic paroxysmal anxiety] without agoraphobia: Secondary | ICD-10-CM | POA: Diagnosis not present

## 2019-12-19 DIAGNOSIS — F909 Attention-deficit hyperactivity disorder, unspecified type: Secondary | ICD-10-CM

## 2019-12-19 DIAGNOSIS — G4700 Insomnia, unspecified: Secondary | ICD-10-CM

## 2019-12-19 DIAGNOSIS — F411 Generalized anxiety disorder: Secondary | ICD-10-CM

## 2019-12-19 MED ORDER — AMPHETAMINE-DEXTROAMPHETAMINE 30 MG PO TABS: 30.0000 mg | ORAL_TABLET | Freq: Every day | ORAL | 0 refills | Status: DC

## 2019-12-19 MED ORDER — TRAZODONE HCL 100 MG PO TABS: ORAL_TABLET | ORAL | 5 refills | Status: DC

## 2019-12-19 MED ORDER — FLUOXETINE HCL 40 MG PO CAPS: 40.0000 mg | ORAL_CAPSULE | Freq: Two times a day (BID) | ORAL | 5 refills | Status: DC

## 2019-12-19 MED ORDER — CLONAZEPAM 2 MG PO TABS: 2.0000 mg | ORAL_TABLET | Freq: Three times a day (TID) | ORAL | 2 refills | Status: DC

## 2019-12-19 NOTE — Progress Notes (Signed)
Dana Kelly [id number] 07-Jun-1965 54 y.o.  Virtual Visit via Telephone Note  I connected with pt on 12/19/19 at  5:20 PM EST by telephone and verified that I am speaking with the correct person using two identifiers.   I discussed the limitations, risks, security and privacy concerns of performing an evaluation and management service by telephone and the availability of in person appointments. I also discussed with the patient that there may be a patient responsible charge related to this service. The patient expressed understanding and agreed to proceed.   I discussed the assessment and treatment plan with the patient. The patient was provided an opportunity to ask questions and all were answered. The patient agreed with the plan and demonstrated an understanding of the instructions.   The patient was advised to call back or seek an in-person evaluation if the symptoms worsen or if the condition fails to improve as anticipated.  I provided 30 minutes of non-face-to-face time during this encounter.  The patient was located at home.  The provider was located at [facility].   [name], NP   Subjective:   Patient ID:  Dana Kelly is a 54 y.o. (DOB [birth year]) female.  Chief Complaint: No chief complaint on file.   HPI AUBERY DATE presents for follow-up of MDD, GAD, insomnia, panic attacks, ADHD, and PTSD.  Describes mood today as "ok". Pleasant. Reports tearfulness at "times". Mood symptoms - reports decreased depression, anxiety, and irritability. Reports some "nervous spells". Is on probation at job until February 18th. Improved interest and motivation. Taking medications as prescribed.  Energy levels vary. Active, does not have a regular exercise routine. Walking some days.  Enjoys some usual interests and activities. Single. Lives alone. Stays with mother some. Mother lives behind her. Appetite decreased. Weight loss - 42 pounds. Sleeping  difficulties. Averages 5 hours.  Focus and concentration stable with Adderall. Completing tasks. Managing aspects of household. 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 Denies AH or VH.  Review of Systems:  Review of Systems  Musculoskeletal: Negative for gait problem.  Neurological: Negative for tremors.  Psychiatric/Behavioral:       Please refer to HPI    Medications: I have reviewed the patient's current medications.  Current Outpatient Medications  Medication Sig Dispense Refill  . [START ON 02/13/2020] amphetamine-dextroamphetamine (ADDERALL) 30 MG tablet Take 1 tablet by mouth daily. 30 tablet 0  . [START ON 01/16/2020] amphetamine-dextroamphetamine (ADDERALL) 30 MG tablet Take 1 tablet by mouth daily. 30 tablet 0  . apixaban (ELIQUIS) 5 MG TABS tablet Take 5 mg by mouth 2 (two) times daily.     Marland Kitchen atorvastatin (LIPITOR) 80 MG tablet Take 80 mg by mouth at bedtime.    . clonazePAM (KLONOPIN) 2 MG tablet Take 1 tablet (2 mg total) by mouth 3 (three) times daily. 90 tablet 2  . ergotamine-caffeine (CAFERGOT) 1-100 MG tablet Take 2 tablets by mouth as needed for migraine. Take if headache is caught early enough and won't need the sumatriptan shot. Do not exceed 3 doses in 24 hours.    Marland Kitchen FLUoxetine (PROZAC) 40 MG capsule Take 1 capsule (40 mg total) by mouth 2 (two) times daily. 60 capsule 5  . furosemide (LASIX) 40 MG tablet Take 40 mg by mouth every other day.     Marland Kitchen HYDROcodone-acetaminophen (NORCO/VICODIN) 5-325 MG tablet Take 1-2 tablets by mouth every 6 (six) hours as needed for moderate pain or severe pain. 10 tablet  0  . ondansetron (ZOFRAN-ODT) 4 MG disintegrating tablet Take 4 mg by mouth every 8 (eight) hours as needed for nausea or vomiting.     . prazosin (MINIPRESS) 2 MG capsule Take 2 mg by mouth at bedtime as needed (nightmares).     . prochlorperazine (COMPAZINE) 10 MG tablet Take 10 mg by mouth as needed for nausea or vomiting.     . promethazine  (PHENERGAN) 25 MG tablet Take 25 mg by mouth every 6 (six) hours as needed for nausea or vomiting.     . SUMAtriptan 6 MG/0.5ML SOAJ Inject 6 mg into the skin as needed (migraine).     . traZODone (DESYREL) 100 MG tablet Take one to three tablets at bedtime. 90 tablet 5   No current facility-administered medications for this visit.    Medication Side Effects: None  Allergies:  Allergies  Allergen Reactions  . Dicyclomine Hives  . Peanut-Containing Drug Products Anaphylaxis    Other reaction(s): Anaphylaxis  . Hydromorphone Hcl Other (See Comments)  . Phenytoin Sodium Extended     Decreases blood pressure  . Pregabalin Other (See Comments)    Other reaction(s): Other (See Comments) Amnesiac episodes   . Sulfa Antibiotics   . Hydromorphone Itching    Other reaction(s): Itching  . Meloxicam Nausea And Vomiting    Past Medical History:  Diagnosis Date  . Anxiety   . History of MRSA infection   . Hypertension   . Seizure (HCC) 2018   2     No family history on file.  Social History   Socioeconomic History  . Marital status: Divorced    Spouse name: Not on file  . Number of children: Not on file  . Years of education: Not on file  . Highest education level: Not on file  Occupational History  . Not on file  Tobacco Use  . Smoking status: Never Smoker  . Smokeless tobacco: Never Used  Vaping Use  . Vaping Use: Never used  Substance and Sexual Activity  . Alcohol use: Yes    Comment: occasional  . Drug use: Never  . Sexual activity: Not on file  Other Topics Concern  . Not on file  Social History Narrative  . Not on file   Social Determinants of Health   Financial Resource Strain: Not on file  Food Insecurity: Not on file  Transportation Needs: Not on file  Physical Activity: Not on file  Stress: Not on file  Social Connections: Not on file  Intimate Partner Violence: Not on file    Past Medical History, Surgical history, Social history, and Family  history were reviewed and updated as appropriate.   Please see review of systems for further details on the patient's review from today.   Objective:   Physical Exam:  There were no vitals taken for this visit.  Physical Exam Constitutional:      General: She is not in acute distress. Musculoskeletal:        General: No deformity.  Neurological:     Mental Status: She is alert and oriented to person, place, and time.     Coordination: Coordination normal.  Psychiatric:        Attention and Perception: Attention and perception normal. She does not perceive auditory or visual hallucinations.        Mood and Affect: Mood normal. Mood is not anxious or depressed. Affect is not labile, blunt, angry or inappropriate.        Speech: Speech  normal.        Behavior: Behavior normal.        Thought Content: Thought content normal. Thought content is not paranoid or delusional. Thought content does not include homicidal or suicidal ideation. Thought content does not include homicidal or suicidal plan.        Cognition and Memory: Cognition and memory normal.        Judgment: Judgment normal.     Comments: Insight intact     Lab Review:     Component Value Date/Time   NA 141 05/28/2019 0549   K 3.9 05/28/2019 0549   CL 105 05/28/2019 0549   CO2 27 05/28/2019 0549   GLUCOSE 115 (H) 05/28/2019 0549   BUN 8 05/28/2019 0549   CREATININE 0.89 05/28/2019 0549   CALCIUM 8.7 (L) 05/28/2019 0549   PROT 5.5 (L) 05/28/2019 0549   ALBUMIN 3.0 (L) 05/28/2019 0549   AST 40 05/28/2019 0549   ALT 56 (H) 05/28/2019 0549   ALKPHOS 69 05/28/2019 0549   BILITOT 0.7 05/28/2019 0549   GFRNONAA >60 05/28/2019 0549   GFRAA >60 05/28/2019 0549       Component Value Date/Time   WBC 8.0 05/28/2019 0549   RBC 3.29 (L) 05/28/2019 0549   HGB 10.6 (L) 05/28/2019 0549   HCT 32.9 (L) 05/28/2019 0549   PLT 297 05/28/2019 0549   MCV 100.0 05/28/2019 0549   MCH 32.2 05/28/2019 0549   MCHC 32.2 05/28/2019  0549   RDW 12.7 05/28/2019 0549    No results found for: POCLITH, LITHIUM   No results found for: PHENYTOIN, PHENOBARB, VALPROATE, CBMZ   .res Assessment: Plan:    Plan:  Clonazepam 2mg  TID  Prozac 40mg  - 2 daily Adderall 30mg  daily  Trazadone 100mg  - 1 to 3 at bedtime  BP 103/80  RTC 3 months   Patient advised to contact office with any questions, adverse effects, or acute worsening in signs and symptoms.  Discussed potential benefits, risk, and side effects of benzodiazepines to include potential risk of tolerance and dependence, as well as possible drowsiness.  Advised patient not to drive if experiencing drowsiness and to take lowest possible effective dose to minimize risk of dependence and tolerance.  Discussed potential metabolic side effects associated with atypical antipsychotics, as well as potential risk for movement side effects. Advised pt to contact office if movement side effects occur.     Diagnoses and all orders for this visit:  PTSD (post-traumatic stress disorder) -     clonazePAM (KLONOPIN) 2 MG tablet; Take 1 tablet (2 mg total) by mouth 3 (three) times daily.  Panic attacks -     clonazePAM (KLONOPIN) 2 MG tablet; Take 1 tablet (2 mg total) by mouth 3 (three) times daily.  Generalized anxiety disorder -     FLUoxetine (PROZAC) 40 MG capsule; Take 1 capsule (40 mg total) by mouth 2 (two) times daily.  Major depressive disorder, recurrent episode, moderate (HCC) -     FLUoxetine (PROZAC) 40 MG capsule; Take 1 capsule (40 mg total) by mouth 2 (two) times daily.  Attention deficit hyperactivity disorder (ADHD), unspecified ADHD type -     amphetamine-dextroamphetamine (ADDERALL) 30 MG tablet; Take 1 tablet by mouth daily. -     amphetamine-dextroamphetamine (ADDERALL) 30 MG tablet; Take 1 tablet by mouth daily.  Insomnia, unspecified type -     traZODone (DESYREL) 100 MG tablet; Take one to three tablets at bedtime. -     clonazePAM (KLONOPIN) 2  MG  tablet; Take 1 tablet (2 mg total) by mouth 3 (three) times daily.    Please see After Visit Summary for patient specific instructions.  No future appointments.  No orders of the defined types were placed in this encounter.     -------------------------------

## 2020-03-15 ENCOUNTER — Other Ambulatory Visit: Payer: Self-pay | Admitting: Adult Health

## 2020-03-15 DIAGNOSIS — F909 Attention-deficit hyperactivity disorder, unspecified type: Secondary | ICD-10-CM

## 2020-04-16 ENCOUNTER — Telehealth: Payer: Self-pay | Admitting: Adult Health

## 2020-04-16 ENCOUNTER — Other Ambulatory Visit: Payer: Self-pay | Admitting: Adult Health

## 2020-04-16 ENCOUNTER — Other Ambulatory Visit: Payer: Self-pay

## 2020-04-16 DIAGNOSIS — G4700 Insomnia, unspecified: Secondary | ICD-10-CM

## 2020-04-16 DIAGNOSIS — F4310 Post-traumatic stress disorder, unspecified: Secondary | ICD-10-CM

## 2020-04-16 DIAGNOSIS — F909 Attention-deficit hyperactivity disorder, unspecified type: Secondary | ICD-10-CM

## 2020-04-16 DIAGNOSIS — F410 Panic disorder [episodic paroxysmal anxiety] without agoraphobia: Secondary | ICD-10-CM

## 2020-04-16 MED ORDER — AMPHETAMINE-DEXTROAMPHETAMINE 30 MG PO TABS: 30.0000 mg | ORAL_TABLET | Freq: Every day | ORAL | 0 refills | Status: DC

## 2020-04-16 MED ORDER — CLONAZEPAM 2 MG PO TABS: 2.0000 mg | ORAL_TABLET | Freq: Three times a day (TID) | ORAL | 0 refills | Status: DC

## 2020-04-16 NOTE — Telephone Encounter (Signed)
Ok to pend for approval.

## 2020-04-16 NOTE — Telephone Encounter (Signed)
Please review

## 2020-04-16 NOTE — Telephone Encounter (Signed)
Klonopin filled on 3/11 and adderall on 3/12.No appt scheduled at the moment

## 2020-04-16 NOTE — Telephone Encounter (Signed)
pended

## 2020-04-16 NOTE — Telephone Encounter (Signed)
Pt would like a refill on Klonopin, and adderall. Please send to [facility] in [place].

## 2020-04-16 NOTE — Telephone Encounter (Signed)
Yes 05/13/20

## 2020-04-16 NOTE — Telephone Encounter (Signed)
When was each last filled and when is next appointment?

## 2020-04-16 NOTE — Telephone Encounter (Signed)
Controlled substance 

## 2020-04-16 NOTE — Telephone Encounter (Signed)
Does she have an upcoming appointment?

## 2020-05-13 ENCOUNTER — Encounter: Payer: Self-pay | Admitting: Adult Health

## 2020-05-13 ENCOUNTER — Telehealth (INDEPENDENT_AMBULATORY_CARE_PROVIDER_SITE_OTHER): Payer: PRIVATE HEALTH INSURANCE | Admitting: Adult Health

## 2020-05-13 DIAGNOSIS — F4310 Post-traumatic stress disorder, unspecified: Secondary | ICD-10-CM

## 2020-05-13 DIAGNOSIS — G4700 Insomnia, unspecified: Secondary | ICD-10-CM

## 2020-05-13 DIAGNOSIS — F411 Generalized anxiety disorder: Secondary | ICD-10-CM | POA: Diagnosis not present

## 2020-05-13 DIAGNOSIS — F909 Attention-deficit hyperactivity disorder, unspecified type: Secondary | ICD-10-CM | POA: Diagnosis not present

## 2020-05-13 DIAGNOSIS — F331 Major depressive disorder, recurrent, moderate: Secondary | ICD-10-CM

## 2020-05-13 DIAGNOSIS — F410 Panic disorder [episodic paroxysmal anxiety] without agoraphobia: Secondary | ICD-10-CM

## 2020-05-13 MED ORDER — AMPHETAMINE-DEXTROAMPHETAMINE 30 MG PO TABS: 30.0000 mg | ORAL_TABLET | Freq: Every day | ORAL | 0 refills | Status: DC

## 2020-05-13 MED ORDER — FLUOXETINE HCL 40 MG PO CAPS: 40.0000 mg | ORAL_CAPSULE | Freq: Two times a day (BID) | ORAL | 5 refills | Status: DC

## 2020-05-13 MED ORDER — TRAZODONE HCL 100 MG PO TABS: ORAL_TABLET | ORAL | 5 refills | Status: DC

## 2020-05-13 MED ORDER — CLONAZEPAM 2 MG PO TABS: 2.0000 mg | ORAL_TABLET | Freq: Three times a day (TID) | ORAL | 0 refills | Status: DC

## 2020-05-13 NOTE — Progress Notes (Deleted)
Dana Kelly [id number] March 17, 1965 55 y.o.  Subjective:   Patient ID:  Dana Kelly is a 55 y.o. (DOB 1965/10/19) female.  Chief Complaint: No chief complaint on file.   HPI Dana Kelly presents to the office today for follow-up of MDD, GAD, insomnia, panic attacks, ADHD, and PTSD.  Describes mood today as "ok". Pleasant. Denies tearfulness. Mood symptoms - reports decreased depression - "a little bit". Moves between anxiety and irritability - "it goes back and forth". Is no longer on probation - job going better - upcoming yearly evaluation. Redecorating her apartment. Mom lives close by - celebrated Mother's day with mother. Improved interest and motivation. Taking medications as prescribed.  Energy levels vary. Active, does not have a regular exercise routine.  Enjoys some usual interests and activities. Single. Lives alone. Stays with mother some. Mother lives behind her. Appetite decreased. Weight gain. Sleeping difficulties. Averages 4 to 5 hours.  Focus and concentration stable with Adderall - foregetting to take some mornings. Completing tasks. Managing aspects of household. 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 Denies AH or VH.  Review of Systems:  Review of Systems  Musculoskeletal: Negative for gait problem.  Neurological: Negative for tremors.  Psychiatric/Behavioral:       Please refer to HPI    Medications: I have reviewed the patient's current medications.  Current Outpatient Medications  Medication Sig Dispense Refill  . amphetamine-dextroamphetamine (ADDERALL) 30 MG tablet Take 1 tablet by mouth daily. 30 tablet 0  . [START ON 06/10/2020] amphetamine-dextroamphetamine (ADDERALL) 30 MG tablet Take 1 tablet by mouth daily. 30 tablet 0  . apixaban (ELIQUIS) 5 MG TABS tablet Take 5 mg by mouth 2 (two) times daily.     Marland Kitchen atorvastatin (LIPITOR) 80 MG tablet Take 80 mg by mouth at bedtime.    . clonazePAM (KLONOPIN) 2 MG tablet Take 1  tablet (2 mg total) by mouth 3 (three) times daily. 90 tablet 0  . ergotamine-caffeine (CAFERGOT) 1-100 MG tablet Take 2 tablets by mouth as needed for migraine. Take if headache is caught early enough and won't need the sumatriptan shot. Do not exceed 3 doses in 24 hours.    Marland Kitchen FLUoxetine (PROZAC) 40 MG capsule Take 1 capsule (40 mg total) by mouth 2 (two) times daily. 60 capsule 5  . furosemide (LASIX) 40 MG tablet Take 40 mg by mouth every other day.     Marland Kitchen HYDROcodone-acetaminophen (NORCO/VICODIN) 5-325 MG tablet Take 1-2 tablets by mouth every 6 (six) hours as needed for moderate pain or severe pain. 10 tablet 0  . ondansetron (ZOFRAN-ODT) 4 MG disintegrating tablet Take 4 mg by mouth every 8 (eight) hours as needed for nausea or vomiting.     . prazosin (MINIPRESS) 2 MG capsule Take 2 mg by mouth at bedtime as needed (nightmares).     . prochlorperazine (COMPAZINE) 10 MG tablet Take 10 mg by mouth as needed for nausea or vomiting.     . promethazine (PHENERGAN) 25 MG tablet Take 25 mg by mouth every 6 (six) hours as needed for nausea or vomiting.     . SUMAtriptan 6 MG/0.5ML SOAJ Inject 6 mg into the skin as needed (migraine).     . traZODone (DESYREL) 100 MG tablet Take one to three tablets at bedtime. 90 tablet 5   No current facility-administered medications for this visit.    Medication Side Effects: None  Allergies:  Allergies  Allergen Reactions  . Dicyclomine Hives  .  Peanut Allergen Powder-Dnfp Anaphylaxis  . Peanut-Containing Drug Products Anaphylaxis    Other reaction(s): Anaphylaxis  . Hydromorphone Hcl Other (See Comments)  . Phenytoin Sodium Extended     Decreases blood pressure  . Pregabalin Other (See Comments)    Other reaction(s): Other (See Comments) Amnesiac episodes   . Sulfa Antibiotics   . Hydromorphone Itching    Other reaction(s): Itching  . Meloxicam Nausea And Vomiting    Past Medical History:  Diagnosis Date  . Anxiety   . History of MRSA  infection   . Hypertension   . Seizure (HCC) 2018   2     Past Medical History, Surgical history, Social history, and Family history were reviewed and updated as appropriate.   Please see review of systems for further details on the patient's review from today.   Objective:   Physical Exam:  There were no vitals taken for this visit.  Physical Exam Constitutional:      General: She is not in acute distress. Musculoskeletal:        General: No deformity.  Neurological:     Mental Status: She is alert and oriented to person, place, and time.     Coordination: Coordination normal.  Psychiatric:        Attention and Perception: Attention and perception normal. She does not perceive auditory or visual hallucinations.        Mood and Affect: Mood normal. Mood is not anxious or depressed. Affect is not labile, blunt, angry or inappropriate.        Speech: Speech normal.        Behavior: Behavior normal.        Thought Content: Thought content normal. Thought content is not paranoid or delusional. Thought content does not include homicidal or suicidal ideation. Thought content does not include homicidal or suicidal plan.        Cognition and Memory: Cognition and memory normal.        Judgment: Judgment normal.     Comments: Insight intact     Lab Review:     Component Value Date/Time   NA 141 05/28/2019 0549   K 3.9 05/28/2019 0549   CL 105 05/28/2019 0549   CO2 27 05/28/2019 0549   GLUCOSE 115 (H) 05/28/2019 0549   BUN 8 05/28/2019 0549   CREATININE 0.89 05/28/2019 0549   CALCIUM 8.7 (L) 05/28/2019 0549   PROT 5.5 (L) 05/28/2019 0549   ALBUMIN 3.0 (L) 05/28/2019 0549   AST 40 05/28/2019 0549   ALT 56 (H) 05/28/2019 0549   ALKPHOS 69 05/28/2019 0549   BILITOT 0.7 05/28/2019 0549   GFRNONAA >60 05/28/2019 0549   GFRAA >60 05/28/2019 0549       Component Value Date/Time   WBC 8.0 05/28/2019 0549   RBC 3.29 (L) 05/28/2019 0549   HGB 10.6 (L) 05/28/2019 0549   HCT  32.9 (L) 05/28/2019 0549   PLT 297 05/28/2019 0549   MCV 100.0 05/28/2019 0549   MCH 32.2 05/28/2019 0549   MCHC 32.2 05/28/2019 0549   RDW 12.7 05/28/2019 0549    No results found for: POCLITH, LITHIUM   No results found for: PHENYTOIN, PHENOBARB, VALPROATE, CBMZ   .res Assessment: Plan:     Plan:  Clonazepam 2mg  TID  Prozac 40mg  - 2 daily Adderall 30mg  daily  Trazadone 100mg  - 1 to 3 at bedtime  BP WNL per patient - followed by PCP.  RTC 3 months   Patient advised to contact office  with any questions, adverse effects, or acute worsening in signs and symptoms.  Discussed potential benefits, risk, and side effects of benzodiazepines to include potential risk of tolerance and dependence, as well as possible drowsiness.  Advised patient not to drive if experiencing drowsiness and to take lowest possible effective dose to minimize risk of dependence and tolerance.  Discussed potential metabolic side effects associated with atypical antipsychotics, as well as potential risk for movement side effects. Advised pt to contact office if movement side effects occur.     Diagnoses and all orders for this visit:  Generalized anxiety disorder -     FLUoxetine (PROZAC) 40 MG capsule; Take 1 capsule (40 mg total) by mouth 2 (two) times daily.  Major depressive disorder, recurrent episode, moderate (HCC) -     FLUoxetine (PROZAC) 40 MG capsule; Take 1 capsule (40 mg total) by mouth 2 (two) times daily.  Attention deficit hyperactivity disorder (ADHD), unspecified ADHD type -     amphetamine-dextroamphetamine (ADDERALL) 30 MG tablet; Take 1 tablet by mouth daily. -     amphetamine-dextroamphetamine (ADDERALL) 30 MG tablet; Take 1 tablet by mouth daily.  Insomnia, unspecified type -     traZODone (DESYREL) 100 MG tablet; Take one to three tablets at bedtime. -     clonazePAM (KLONOPIN) 2 MG tablet; Take 1 tablet (2 mg total) by mouth 3 (three) times daily.  PTSD (post-traumatic  stress disorder) -     clonazePAM (KLONOPIN) 2 MG tablet; Take 1 tablet (2 mg total) by mouth 3 (three) times daily.  Panic attacks -     clonazePAM (KLONOPIN) 2 MG tablet; Take 1 tablet (2 mg total) by mouth 3 (three) times daily.     Please see After Visit Summary for patient specific instructions.  No future appointments.  No orders of the defined types were placed in this encounter.   -------------------------------

## 2020-05-17 NOTE — Progress Notes (Signed)
Dana Kelly [id number] 11-15-1965 55 y.o.  Virtual Visit via Telephone Note  I connected with pt on 05/17/20 at  3:20 PM EDT by telephone and verified that I am speaking with the correct person using two identifiers.   I discussed the limitations, risks, security and privacy concerns of performing an evaluation and management service by telephone and the availability of in person appointments. I also discussed with the patient that there may be a patient responsible charge related to this service. The patient expressed understanding and agreed to proceed.   I discussed the assessment and treatment plan with the patient. The patient was provided an opportunity to ask questions and all were answered. The patient agreed with the plan and demonstrated an understanding of the instructions.   The patient was advised to call back or seek an in-person evaluation if the symptoms worsen or if the condition fails to improve as anticipated.  I provided 30 minutes of non-face-to-face time during this encounter.  The patient was located at home.  The provider was located at [facility].   [name], NP   Subjective:   Patient ID:  Dana Kelly is a 55 y.o. (DOB 17-Jun-1965) female.  Chief Complaint: No chief complaint on file.   HPI ANUPAMA PIEHL presents for follow-up of MDD, GAD, insomnia, panic attacks, ADHD, and PTSD.  Describes mood today as "ok". Pleasant. Denies tearfulness. Mood symptoms - reports decreased depression - "a little bit". Moves between anxiety and irritability - "it goes back and forth". Is no longer on probation - job going better - upcoming yearly evaluation. Redecorating her apartment. Mom lives close by - celebrated Mother's day with mother. Improved interest and motivation. Taking medications as prescribed.  Energy levels vary. Active, does not have a regular exercise routine.  Enjoys some usual interests and activities. Single. Lives alone.  Stays with mother some. Mother lives behind her. Appetite decreased. Weight gain. Sleeping difficulties. Averages 4 to 5 hours.  Focus and concentration stable with Adderall - foregetting to take some mornings. Completing tasks. Managing aspects of household. 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 Denies AH or VH.   Review of Systems:  Review of Systems  Musculoskeletal: Negative for gait problem.  Neurological: Negative for tremors.  Psychiatric/Behavioral:       Please refer to HPI    Medications: I have reviewed the patient's current medications.  Current Outpatient Medications  Medication Sig Dispense Refill  . amphetamine-dextroamphetamine (ADDERALL) 30 MG tablet Take 1 tablet by mouth daily. 30 tablet 0  . [START ON 06/10/2020] amphetamine-dextroamphetamine (ADDERALL) 30 MG tablet Take 1 tablet by mouth daily. 30 tablet 0  . apixaban (ELIQUIS) 5 MG TABS tablet Take 5 mg by mouth 2 (two) times daily.     Marland Kitchen atorvastatin (LIPITOR) 80 MG tablet Take 80 mg by mouth at bedtime.    . clonazePAM (KLONOPIN) 2 MG tablet Take 1 tablet (2 mg total) by mouth 3 (three) times daily. 90 tablet 0  . ergotamine-caffeine (CAFERGOT) 1-100 MG tablet Take 2 tablets by mouth as needed for migraine. Take if headache is caught early enough and won't need the sumatriptan shot. Do not exceed 3 doses in 24 hours.    Marland Kitchen FLUoxetine (PROZAC) 40 MG capsule Take 1 capsule (40 mg total) by mouth 2 (two) times daily. 60 capsule 5  . furosemide (LASIX) 40 MG tablet Take 40 mg by mouth every other day.     Marland Kitchen  HYDROcodone-acetaminophen (NORCO/VICODIN) 5-325 MG tablet Take 1-2 tablets by mouth every 6 (six) hours as needed for moderate pain or severe pain. 10 tablet 0  . ondansetron (ZOFRAN-ODT) 4 MG disintegrating tablet Take 4 mg by mouth every 8 (eight) hours as needed for nausea or vomiting.     . prazosin (MINIPRESS) 2 MG capsule Take 2 mg by mouth at bedtime as needed (nightmares).     .  prochlorperazine (COMPAZINE) 10 MG tablet Take 10 mg by mouth as needed for nausea or vomiting.     . promethazine (PHENERGAN) 25 MG tablet Take 25 mg by mouth every 6 (six) hours as needed for nausea or vomiting.     . SUMAtriptan 6 MG/0.5ML SOAJ Inject 6 mg into the skin as needed (migraine).     . traZODone (DESYREL) 100 MG tablet Take one to three tablets at bedtime. 90 tablet 5   No current facility-administered medications for this visit.    Medication Side Effects: None  Allergies:  Allergies  Allergen Reactions  . Dicyclomine Hives  . Peanut Allergen Powder-Dnfp Anaphylaxis  . Peanut-Containing Drug Products Anaphylaxis    Other reaction(s): Anaphylaxis  . Hydromorphone Hcl Other (See Comments)  . Phenytoin Sodium Extended     Decreases blood pressure  . Pregabalin Other (See Comments)    Other reaction(s): Other (See Comments) Amnesiac episodes   . Sulfa Antibiotics   . Hydromorphone Itching    Other reaction(s): Itching  . Meloxicam Nausea And Vomiting    Past Medical History:  Diagnosis Date  . Anxiety   . History of MRSA infection   . Hypertension   . Seizure (HCC) 2018   2     No family history on file.  Social History   Socioeconomic History  . Marital status: Divorced    Spouse name: Not on file  . Number of children: Not on file  . Years of education: Not on file  . Highest education level: Not on file  Occupational History  . Not on file  Tobacco Use  . Smoking status: Never Smoker  . Smokeless tobacco: Never Used  Vaping Use  . Vaping Use: Never used  Substance and Sexual Activity  . Alcohol use: Yes    Comment: occasional  . Drug use: Never  . Sexual activity: Not on file  Other Topics Concern  . Not on file  Social History Narrative  . Not on file   Social Determinants of Health   Financial Resource Strain: Not on file  Food Insecurity: Not on file  Transportation Needs: Not on file  Physical Activity: Not on file  Stress:  Not on file  Social Connections: Not on file  Intimate Partner Violence: Not on file    Past Medical History, Surgical history, Social history, and Family history were reviewed and updated as appropriate.   Please see review of systems for further details on the patient's review from today.   Objective:   Physical Exam:  There were no vitals taken for this visit.  Physical Exam Neurological:     Mental Status: She is alert and oriented to person, place, and time.     Cranial Nerves: No dysarthria.  Psychiatric:        Attention and Perception: Attention and perception normal.        Mood and Affect: Mood normal.        Speech: Speech normal.        Behavior: Behavior is cooperative.  Thought Content: Thought content normal. Thought content is not paranoid or delusional. Thought content does not include homicidal or suicidal ideation. Thought content does not include homicidal or suicidal plan.        Cognition and Memory: Cognition and memory normal.        Judgment: Judgment normal.     Comments: Insight intact     Lab Review:     Component Value Date/Time   NA 141 05/28/2019 0549   K 3.9 05/28/2019 0549   CL 105 05/28/2019 0549   CO2 27 05/28/2019 0549   GLUCOSE 115 (H) 05/28/2019 0549   BUN 8 05/28/2019 0549   CREATININE 0.89 05/28/2019 0549   CALCIUM 8.7 (L) 05/28/2019 0549   PROT 5.5 (L) 05/28/2019 0549   ALBUMIN 3.0 (L) 05/28/2019 0549   AST 40 05/28/2019 0549   ALT 56 (H) 05/28/2019 0549   ALKPHOS 69 05/28/2019 0549   BILITOT 0.7 05/28/2019 0549   GFRNONAA >60 05/28/2019 0549   GFRAA >60 05/28/2019 0549       Component Value Date/Time   WBC 8.0 05/28/2019 0549   RBC 3.29 (L) 05/28/2019 0549   HGB 10.6 (L) 05/28/2019 0549   HCT 32.9 (L) 05/28/2019 0549   PLT 297 05/28/2019 0549   MCV 100.0 05/28/2019 0549   MCH 32.2 05/28/2019 0549   MCHC 32.2 05/28/2019 0549   RDW 12.7 05/28/2019 0549    No results found for: POCLITH, LITHIUM   No results  found for: PHENYTOIN, PHENOBARB, VALPROATE, CBMZ   .res Assessment: Plan:     Plan:  Clonazepam 2mg  TID  Prozac 40mg  - 2 daily Adderall 30mg  daily  Trazadone 100mg  - 1 to 3 at bedtime  BP WNL per patient - followed by PCP.  RTC 3 months   Patient advised to contact office with any questions, adverse effects, or acute worsening in signs and symptoms.  Discussed potential benefits, risk, and side effects of benzodiazepines to include potential risk of tolerance and dependence, as well as possible drowsiness.  Advised patient not to drive if experiencing drowsiness and to take lowest possible effective dose to minimize risk of dependence and tolerance.  Discussed potential metabolic side effects associated with atypical antipsychotics, as well as potential risk for movement side effects. Advised pt to contact office if movement side effects occur.     Diagnoses and all orders for this visit:  Generalized anxiety disorder -     FLUoxetine (PROZAC) 40 MG capsule; Take 1 capsule (40 mg total) by mouth 2 (two) times daily.  Major depressive disorder, recurrent episode, moderate (HCC) -     FLUoxetine (PROZAC) 40 MG capsule; Take 1 capsule (40 mg total) by mouth 2 (two) times daily.  Attention deficit hyperactivity disorder (ADHD), unspecified ADHD type -     amphetamine-dextroamphetamine (ADDERALL) 30 MG tablet; Take 1 tablet by mouth daily. -     amphetamine-dextroamphetamine (ADDERALL) 30 MG tablet; Take 1 tablet by mouth daily.  Insomnia, unspecified type -     traZODone (DESYREL) 100 MG tablet; Take one to three tablets at bedtime. -     clonazePAM (KLONOPIN) 2 MG tablet; Take 1 tablet (2 mg total) by mouth 3 (three) times daily.  PTSD (post-traumatic stress disorder) -     clonazePAM (KLONOPIN) 2 MG tablet; Take 1 tablet (2 mg total) by mouth 3 (three) times daily.  Panic attacks -     clonazePAM (KLONOPIN) 2 MG tablet; Take 1 tablet (2 mg total) by mouth 3 (  three) times  daily.    Please see After Visit Summary for patient specific instructions.  Future Appointments  Date Time Provider Department Center  07/01/2020  1:20 PM Keshun Berrett, Thereasa Solo, NP [facility] None    No orders of the defined types were placed in this encounter.     -------------------------------

## 2020-07-01 ENCOUNTER — Encounter: Payer: Self-pay | Admitting: Adult Health

## 2020-07-01 ENCOUNTER — Telehealth (INDEPENDENT_AMBULATORY_CARE_PROVIDER_SITE_OTHER): Payer: PRIVATE HEALTH INSURANCE | Admitting: Adult Health

## 2020-07-01 DIAGNOSIS — F411 Generalized anxiety disorder: Secondary | ICD-10-CM

## 2020-07-01 DIAGNOSIS — F909 Attention-deficit hyperactivity disorder, unspecified type: Secondary | ICD-10-CM

## 2020-07-01 DIAGNOSIS — F410 Panic disorder [episodic paroxysmal anxiety] without agoraphobia: Secondary | ICD-10-CM

## 2020-07-01 DIAGNOSIS — G4700 Insomnia, unspecified: Secondary | ICD-10-CM | POA: Diagnosis not present

## 2020-07-01 DIAGNOSIS — F331 Major depressive disorder, recurrent, moderate: Secondary | ICD-10-CM | POA: Diagnosis not present

## 2020-07-01 DIAGNOSIS — F4310 Post-traumatic stress disorder, unspecified: Secondary | ICD-10-CM

## 2020-07-01 MED ORDER — AMPHETAMINE-DEXTROAMPHETAMINE 30 MG PO TABS: 30.0000 mg | ORAL_TABLET | Freq: Every day | ORAL | 0 refills | Status: DC

## 2020-07-01 MED ORDER — CLONAZEPAM 2 MG PO TABS: 2.0000 mg | ORAL_TABLET | Freq: Three times a day (TID) | ORAL | 2 refills | Status: DC

## 2020-07-01 NOTE — Progress Notes (Signed)
Dana Kelly [id number] 04/21/1965 55 y.o.  Virtual Visit via Telephone Note  I connected with pt on 07/01/20 at  1:20 PM EDT by telephone and verified that I am speaking with the correct person using two identifiers.   I discussed the limitations, risks, security and privacy concerns of performing an evaluation and management service by telephone and the availability of in person appointments. I also discussed with the patient that there may be a patient responsible charge related to this service. The patient expressed understanding and agreed to proceed.   I discussed the assessment and treatment plan with the patient. The patient was provided an opportunity to ask questions and all were answered. The patient agreed with the plan and demonstrated an understanding of the instructions.   The patient was advised to call back or seek an in-person evaluation if the symptoms worsen or if the condition fails to improve as anticipated.  I provided 30 minutes of non-face-to-face time during this encounter.  The patient was located at home.  The provider was located at [facility].   Dorothyann Gibbsegina N Ercie Eliasen, NP   Subjective:   Patient ID:  Dana LeveringKimberly L Kundinger is a 55 y.o. (DOB 02/12/1965) female.  Chief Complaint: No chief complaint on file.   HPI Dana LeveringKimberly L Weichel presents for follow-up of MDD, GAD, insomnia, panic attacks, ADHD, and PTSD.  Describes mood today as "ok". Pleasant. Denies tearfulness. Mood symptoms - denies depression, anxiety and irritability. Stating "it has been a complete turn around for me". Feels like medications are working well. Things going well at work[place] Mother doing better - more independent. Improved interest and motivation. Taking medications as prescribed.  Energy levels stable. Active, does not have a regular exercise routine.  Enjoys some usual interests and activities. Single. Lives alone. Stays with mother some. Mother  lives behind her. Appetite decreased. Weight loss - 237 pounds. Sleeping difficulties. Averages 4 to 7 hours.  Focus and concentration stable with Adderall. Completing tasks. Managing aspects of household. Works full-time - IT consultantaralegal x 16 years. Denies SI or HI. Denies AH or VH.  Review of Systems:  Review of Systems  Musculoskeletal:  Negative for gait problem.  Neurological:  Negative for tremors.  Psychiatric/Behavioral:         Please refer to HPI   Medications: I have reviewed the patient's current medications.  Current Outpatient Medications  Medication Sig Dispense Refill   [START ON 08/26/2020] amphetamine-dextroamphetamine (ADDERALL) 30 MG tablet Take 1 tablet by mouth daily. 30 tablet 0   amphetamine-dextroamphetamine (ADDERALL) 30 MG tablet Take 1 tablet by mouth daily. 30 tablet 0   [START ON 07/29/2020] amphetamine-dextroamphetamine (ADDERALL) 30 MG tablet Take 1 tablet by mouth daily. 30 tablet 0   apixaban (ELIQUIS) 5 MG TABS tablet Take 5 mg by mouth 2 (two) times daily.      atorvastatin (LIPITOR) 80 MG tablet Take 80 mg by mouth at bedtime.     clonazePAM (KLONOPIN) 2 MG tablet Take 1 tablet (2 mg total) by mouth 3 (three) times daily. 90 tablet 2   ergotamine-caffeine (CAFERGOT) 1-100 MG tablet Take 2 tablets by mouth as needed for migraine. Take if headache is caught early enough and won't need the sumatriptan shot. Do not exceed 3 doses in 24 hours.     FLUoxetine (PROZAC) 40 MG capsule Take 1 capsule (40 mg total) by mouth 2 (two) times daily. 60 capsule 5   furosemide (LASIX) 40 MG tablet Take  40 mg by mouth every other day.      HYDROcodone-acetaminophen (NORCO/VICODIN) 5-325 MG tablet Take 1-2 tablets by mouth every 6 (six) hours as needed for moderate pain or severe pain. 10 tablet 0   ondansetron (ZOFRAN-ODT) 4 MG disintegrating tablet Take 4 mg by mouth every 8 (eight) hours as needed for nausea or vomiting.      prazosin (MINIPRESS) 2 MG capsule Take 2 mg by  mouth at bedtime as needed (nightmares).      prochlorperazine (COMPAZINE) 10 MG tablet Take 10 mg by mouth as needed for nausea or vomiting.      promethazine (PHENERGAN) 25 MG tablet Take 25 mg by mouth every 6 (six) hours as needed for nausea or vomiting.      SUMAtriptan 6 MG/0.5ML SOAJ Inject 6 mg into the skin as needed (migraine).      traZODone (DESYREL) 100 MG tablet Take one to three tablets at bedtime. 90 tablet 5   No current facility-administered medications for this visit.    Medication Side Effects: None  Allergies:  Allergies  Allergen Reactions   Dicyclomine Hives   Peanut Allergen Powder-Dnfp Anaphylaxis   Peanut-Containing Drug Products Anaphylaxis    Other reaction(s): Anaphylaxis   Hydromorphone Hcl Other (See Comments)   Phenytoin Sodium Extended     Decreases blood pressure   Pregabalin Other (See Comments)    Other reaction(s): Other (See Comments) Amnesiac episodes    Sulfa Antibiotics    Hydromorphone Itching    Other reaction(s): Itching   Meloxicam Nausea And Vomiting    Past Medical History:  Diagnosis Date   Anxiety    History of MRSA infection    Hypertension    Seizure (HCC) 2018   2     No family history on file.  Social History   Socioeconomic History   Marital status: Divorced    Spouse name: Not on file   Number of children: Not on file   Years of education: Not on file   Highest education level: Not on file  Occupational History   Not on file  Tobacco Use   Smoking status: Never   Smokeless tobacco: Never  Vaping Use   Vaping Use: Never used  Substance and Sexual Activity   Alcohol use: Yes    Comment: occasional   Drug use: Never   Sexual activity: Not on file  Other Topics Concern   Not on file  Social History Narrative   Not on file   Social Determinants of Health   Financial Resource Strain: Not on file  Food Insecurity: Not on file  Transportation Needs: Not on file  Physical Activity: Not on file   Stress: Not on file  Social Connections: Not on file  Intimate Partner Violence: Not on file    Past Medical History, Surgical history, Social history, and Family history were reviewed and updated as appropriate.   Please see review of systems for further details on the patient's review from today.   Objective:   Physical Exam:  There were no vitals taken for this visit.  Physical Exam Constitutional:      General: She is not in acute distress. Musculoskeletal:        General: No deformity.  Neurological:     Mental Status: She is alert and oriented to person, place, and time.     Coordination: Coordination normal.  Psychiatric:        Attention and Perception: Attention and perception normal. She does not  perceive auditory or visual hallucinations.        Mood and Affect: Mood normal. Mood is not anxious or depressed. Affect is not labile, blunt, angry or inappropriate.        Speech: Speech normal.        Behavior: Behavior normal.        Thought Content: Thought content normal. Thought content is not paranoid or delusional. Thought content does not include homicidal or suicidal ideation. Thought content does not include homicidal or suicidal plan.        Cognition and Memory: Cognition and memory normal.        Judgment: Judgment normal.     Comments: Insight intact    Lab Review:     Component Value Date/Time   NA 141 05/28/2019 0549   K 3.9 05/28/2019 0549   CL 105 05/28/2019 0549   CO2 27 05/28/2019 0549   GLUCOSE 115 (H) 05/28/2019 0549   BUN 8 05/28/2019 0549   CREATININE 0.89 05/28/2019 0549   CALCIUM 8.7 (L) 05/28/2019 0549   PROT 5.5 (L) 05/28/2019 0549   ALBUMIN 3.0 (L) 05/28/2019 0549   AST 40 05/28/2019 0549   ALT 56 (H) 05/28/2019 0549   ALKPHOS 69 05/28/2019 0549   BILITOT 0.7 05/28/2019 0549   GFRNONAA >60 05/28/2019 0549   GFRAA >60 05/28/2019 0549       Component Value Date/Time   WBC 8.0 05/28/2019 0549   RBC 3.29 (L) 05/28/2019 0549    HGB 10.6 (L) 05/28/2019 0549   HCT 32.9 (L) 05/28/2019 0549   PLT 297 05/28/2019 0549   MCV 100.0 05/28/2019 0549   MCH 32.2 05/28/2019 0549   MCHC 32.2 05/28/2019 0549   RDW 12.7 05/28/2019 0549    No results found for: POCLITH, LITHIUM   No results found for: PHENYTOIN, PHENOBARB, VALPROATE, CBMZ   .res Assessment: Plan:     Plan:  Clonazepam 2mg  TID  Prozac 40mg  - 2 daily Adderall 30mg  daily  Trazadone 100mg  - 1 to 3 at bedtime  BP WNL per patient - followed by PCP.  RTC 6 months   Patient advised to contact office with any questions, adverse effects, or acute worsening in signs and symptoms.  Discussed potential benefits, risk, and side effects of benzodiazepines to include potential risk of tolerance and dependence, as well as possible drowsiness.  Advised patient not to drive if experiencing drowsiness and to take lowest possible effective dose to minimize risk of dependence and tolerance.  Discussed potential metabolic side effects associated with atypical antipsychotics, as well as potential risk for movement side effects. Advised pt to contact office if movement side effects occur.     Diagnoses and all orders for this visit:  Generalized anxiety disorder  Major depressive disorder, recurrent episode, moderate (HCC)  Attention deficit hyperactivity disorder (ADHD), unspecified ADHD type -     amphetamine-dextroamphetamine (ADDERALL) 30 MG tablet; Take 1 tablet by mouth daily. -     amphetamine-dextroamphetamine (ADDERALL) 30 MG tablet; Take 1 tablet by mouth daily. -     amphetamine-dextroamphetamine (ADDERALL) 30 MG tablet; Take 1 tablet by mouth daily.  Insomnia, unspecified type -     clonazePAM (KLONOPIN) 2 MG tablet; Take 1 tablet (2 mg total) by mouth 3 (three) times daily.  PTSD (post-traumatic stress disorder) -     clonazePAM (KLONOPIN) 2 MG tablet; Take 1 tablet (2 mg total) by mouth 3 (three) times daily.  Panic attacks -     clonazePAM  (  KLONOPIN) 2 MG tablet; Take 1 tablet (2 mg total) by mouth 3 (three) times daily.   Please see After Visit Summary for patient specific instructions.  No future appointments.  No orders of the defined types were placed in this encounter.     -------------------------------

## 2020-10-24 ENCOUNTER — Other Ambulatory Visit: Payer: Self-pay | Admitting: Adult Health

## 2020-10-24 DIAGNOSIS — G4700 Insomnia, unspecified: Secondary | ICD-10-CM

## 2020-10-24 DIAGNOSIS — F4310 Post-traumatic stress disorder, unspecified: Secondary | ICD-10-CM

## 2020-10-24 DIAGNOSIS — F410 Panic disorder [episodic paroxysmal anxiety] without agoraphobia: Secondary | ICD-10-CM

## 2020-10-24 NOTE — Telephone Encounter (Signed)
Pt called requesting Klonopin refill asap. She is out of meds and afraid she may have a seizure if she doesn't get the medicine.

## 2020-10-24 NOTE — Telephone Encounter (Signed)
Last filled 9/20 appt on 12/19

## 2020-10-28 ENCOUNTER — Telehealth: Payer: Self-pay | Admitting: Adult Health

## 2020-10-28 NOTE — Telephone Encounter (Signed)
Dana Kelly called in concerning he medication. States that she has a question about it. Pls rtc [phone]. Appt 12/19

## 2020-10-28 NOTE — Telephone Encounter (Signed)
Please give her a call 

## 2020-10-29 NOTE — Telephone Encounter (Signed)
Sometimes taking it earlier can be helpful for some people. She can try it and let us know how it's going. If she continues to struggle we can consider other options.

## 2020-10-29 NOTE — Telephone Encounter (Signed)
LVM with the info from [place].

## 2020-10-30 NOTE — Telephone Encounter (Signed)
Patient called back in today with questions about medications. Would like a rtc. Ph [phone]

## 2020-10-30 NOTE — Telephone Encounter (Signed)
Patient called back with more information she feels you need to know.  She states she will go 2-3 days where she has normal sleep, 2-3 days where she doesn't "sleep at all" and 2-3 days where she wants to sleep all day. She is going to work and said no one would know that she had not slept. She went over again all the history about her falling and her mom falling.  She is not depressed but is anxious and nervous. Work has been busy, there are issues with her ex and his new wife. She states when she doesn't sleep she overthinks and watches the clock. I asked if she had tried reading a boring book and she said it didn't work. She seems a little high strung today and I asked how long she slept last night and she said 4 hours.

## 2020-10-30 NOTE — Telephone Encounter (Signed)
Is she taking a consistent dose of Trazadone every night - keeping the same sleep routine? Is she taking the Adderall early morning or late morning?

## 2020-10-30 NOTE — Telephone Encounter (Signed)
She stated she was not taking the trazodone at all now because she didn't feel like it helped. She states she takes her Adderall consistently at about 7 AM.  She also states she is maintaining a consistent sleep routine.

## 2020-10-31 NOTE — Telephone Encounter (Signed)
We could try a low dose of Seroquel 25 to 50mg  to help with sleep and racing thoughts.

## 2020-11-01 MED ORDER — QUETIAPINE FUMARATE 25 MG PO TABS: 25.0000 mg | ORAL_TABLET | Freq: Every day | ORAL | 0 refills | Status: DC

## 2020-11-22 ENCOUNTER — Other Ambulatory Visit: Payer: Self-pay | Admitting: Adult Health

## 2020-11-22 DIAGNOSIS — F909 Attention-deficit hyperactivity disorder, unspecified type: Secondary | ICD-10-CM

## 2020-12-23 ENCOUNTER — Other Ambulatory Visit: Payer: Self-pay

## 2020-12-23 ENCOUNTER — Telehealth: Payer: Self-pay | Admitting: Adult Health

## 2020-12-23 ENCOUNTER — Ambulatory Visit (INDEPENDENT_AMBULATORY_CARE_PROVIDER_SITE_OTHER): Payer: PRIVATE HEALTH INSURANCE | Admitting: Adult Health

## 2020-12-23 ENCOUNTER — Encounter: Payer: Self-pay | Admitting: Adult Health

## 2020-12-23 DIAGNOSIS — F331 Major depressive disorder, recurrent, moderate: Secondary | ICD-10-CM | POA: Diagnosis not present

## 2020-12-23 DIAGNOSIS — F410 Panic disorder [episodic paroxysmal anxiety] without agoraphobia: Secondary | ICD-10-CM

## 2020-12-23 DIAGNOSIS — F909 Attention-deficit hyperactivity disorder, unspecified type: Secondary | ICD-10-CM | POA: Diagnosis not present

## 2020-12-23 DIAGNOSIS — F411 Generalized anxiety disorder: Secondary | ICD-10-CM

## 2020-12-23 DIAGNOSIS — G4700 Insomnia, unspecified: Secondary | ICD-10-CM

## 2020-12-23 DIAGNOSIS — F4310 Post-traumatic stress disorder, unspecified: Secondary | ICD-10-CM

## 2020-12-23 MED ORDER — CLONAZEPAM 2 MG PO TABS: 2.0000 mg | ORAL_TABLET | Freq: Three times a day (TID) | ORAL | 2 refills | Status: DC

## 2020-12-23 MED ORDER — FLUOXETINE HCL 40 MG PO CAPS: 40.0000 mg | ORAL_CAPSULE | Freq: Two times a day (BID) | ORAL | 5 refills | Status: DC

## 2020-12-23 MED ORDER — AMPHETAMINE-DEXTROAMPHETAMINE 30 MG PO TABS: 30.0000 mg | ORAL_TABLET | Freq: Every day | ORAL | 0 refills | Status: DC

## 2020-12-23 MED ORDER — QUETIAPINE FUMARATE 100 MG PO TABS: 100.0000 mg | ORAL_TABLET | Freq: Every day | ORAL | 5 refills | Status: DC

## 2020-12-23 NOTE — Telephone Encounter (Signed)
Dana Kelly called and said that [facility] is out of the Adderall 30 mg where the prescription was called into. Dana Kelly needs it called in to the following pharmacy as they have the Adderall 30 mg in stock.   [facility], [address], [place], [place] [address] Phone number is [phone]

## 2020-12-23 NOTE — Telephone Encounter (Signed)
Cancelled and pended  

## 2020-12-23 NOTE — Progress Notes (Signed)
Dana Kelly [id number] May 05, 1965 55 y.o.  Virtual Visit via Telephone Note  I connected with pt on 12/23/20 at 10:40 AM EST by telephone and verified that I am speaking with the correct person using two identifiers.   I discussed the limitations, risks, security and privacy concerns of performing an evaluation and management service by telephone and the availability of in person appointments. I also discussed with the patient that there may be a patient responsible charge related to this service. The patient expressed understanding and agreed to proceed.   I discussed the assessment and treatment plan with the patient. The patient was provided an opportunity to ask questions and all were answered. The patient agreed with the plan and demonstrated an understanding of the instructions.   The patient was advised to call back or seek an in-person evaluation if the symptoms worsen or if the condition fails to improve as anticipated.  I provided 25 minutes of non-face-to-face time during this encounter.  The patient was located at home.  The provider was located at [facility].   [name], NP   Subjective:   Patient ID:  Dana Kelly is a 55 y.o. (DOB May 12, 1965) female.  Chief Complaint: No chief complaint on file.   HPI Dana Kelly presents for follow-up of MDD, GAD, insomnia, panic attacks, ADHD, and PTSD.  Describes mood today as "ok". Pleasant. Denies tearfulness. Mood symptoms - denies anxiety and irritability. Reports some depression - "holiday related". Stating "I'm not doing as well as I was". Fell and broke elbow - out of work for the past 10 weeks. Has returned to work - IT consultant. Increased migraines. Feels like medications are working well. Caring for mother when needed. Stableinterest and motivation. Taking medications as prescribed.  Energy levels stable. Active, does not have a regular exercise routine with current physical disabilities.   Enjoys some usual interests and activities. Single. Lives alone. Stays with mother some. Mother lives behind her. Appetite adequate. Weight gain - 237 to 250 pounds. Sleeping difficulties. Average 4 hours. Does not feel like Trazadone was helpful - switched to Seroquel at  - not helpful - wanting to increase dose. Focus and concentration stable. Completing tasks. Managing aspects of household. 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 Denies AH or VH.    Review of Systems:  Review of Systems  Musculoskeletal:  Negative for gait problem.  Neurological:  Negative for tremors.  Psychiatric/Behavioral:         Please refer to HPI   Medications: I have reviewed the patient's current medications.  Current Outpatient Medications  Medication Sig Dispense Refill   amphetamine-dextroamphetamine (ADDERALL) 30 MG tablet Take 1 tablet by mouth daily. 30 tablet 0   [START ON 01/20/2021] amphetamine-dextroamphetamine (ADDERALL) 30 MG tablet Take 1 tablet by mouth daily. 30 tablet 0   [START ON 02/17/2021] amphetamine-dextroamphetamine (ADDERALL) 30 MG tablet Take 1 tablet by mouth daily. 30 tablet 0   apixaban (ELIQUIS) 5 MG TABS tablet Take 5 mg by mouth 2 (two) times daily.      atorvastatin (LIPITOR) 80 MG tablet Take 80 mg by mouth at bedtime.     clonazePAM (KLONOPIN) 2 MG tablet Take 1 tablet (2 mg total) by mouth 3 (three) times daily. 90 tablet 2   ergotamine-caffeine (CAFERGOT) 1-100 MG tablet Take 2 tablets by mouth as needed for migraine. Take if headache is caught early enough and won't need the sumatriptan shot. Do not exceed 3 doses  in 24 hours.     FLUoxetine (PROZAC) 40 MG capsule Take 1 capsule (40 mg total) by mouth 2 (two) times daily. 60 capsule 5   furosemide (LASIX) 40 MG tablet Take 40 mg by mouth every other day.      HYDROcodone-acetaminophen (NORCO/VICODIN) 5-325 MG tablet Take 1-2 tablets by mouth every 6 (six) hours as needed for moderate pain or severe  pain. 10 tablet 0   ondansetron (ZOFRAN-ODT) 4 MG disintegrating tablet Take 4 mg by mouth every 8 (eight) hours as needed for nausea or vomiting.      prazosin (MINIPRESS) 2 MG capsule Take 2 mg by mouth at bedtime as needed (nightmares).      prochlorperazine (COMPAZINE) 10 MG tablet Take 10 mg by mouth as needed for nausea or vomiting.      promethazine (PHENERGAN) 25 MG tablet Take 25 mg by mouth every 6 (six) hours as needed for nausea or vomiting.      QUEtiapine (SEROQUEL) 100 MG tablet Take 1 tablet (100 mg total) by mouth at bedtime. 30 tablet 5   SUMAtriptan 6 MG/0.5ML SOAJ Inject 6 mg into the skin as needed (migraine).      traZODone (DESYREL) 100 MG tablet Take one to three tablets at bedtime. 90 tablet 5   No current facility-administered medications for this visit.    Medication Side Effects: None  Allergies:  Allergies  Allergen Reactions   Dicyclomine Hives   Peanut Allergen Powder-Dnfp Anaphylaxis   Peanut-Containing Drug Products Anaphylaxis    Other reaction(s): Anaphylaxis   Hydromorphone Hcl Other (See Comments)   Phenytoin Sodium Extended     Decreases blood pressure   Pregabalin Other (See Comments)    Other reaction(s): Other (See Comments) Amnesiac episodes    Sulfa Antibiotics    Hydromorphone Itching    Other reaction(s): Itching   Meloxicam Nausea And Vomiting    Past Medical History:  Diagnosis Date   Anxiety    History of MRSA infection    Hypertension    Seizure (HCC) 2018   2     No family history on file.  Social History   Socioeconomic History   Marital status: Divorced    Spouse name: Not on file   Number of children: Not on file   Years of education: Not on file   Highest education level: Not on file  Occupational History   Not on file  Tobacco Use   Smoking status: Never   Smokeless tobacco: Never  Vaping Use   Vaping Use: Never used  Substance and Sexual Activity   Alcohol use: Yes    Comment: occasional   Drug use:  Never   Sexual activity: Not on file  Other Topics Concern   Not on file  Social History Narrative   Not on file   Social Determinants of Health   Financial Resource Strain: Not on file  Food Insecurity: Not on file  Transportation Needs: Not on file  Physical Activity: Not on file  Stress: Not on file  Social Connections: Not on file  Intimate Partner Violence: Not on file    Past Medical History, Surgical history, Social history, and Family history were reviewed and updated as appropriate.   Please see review of systems for further details on the patient's review from today.   Objective:   Physical Exam:  There were no vitals taken for this visit.  Physical Exam Constitutional:      General: She is not in acute distress.  Musculoskeletal:        General: No deformity.  Neurological:     Mental Status: She is alert and oriented to person, place, and time.     Coordination: Coordination normal.  Psychiatric:        Attention and Perception: Attention and perception normal. She does not perceive auditory or visual hallucinations.        Mood and Affect: Mood normal. Mood is not anxious or depressed. Affect is not labile, blunt, angry or inappropriate.        Speech: Speech normal.        Behavior: Behavior normal.        Thought Content: Thought content normal. Thought content is not paranoid or delusional. Thought content does not include homicidal or suicidal ideation. Thought content does not include homicidal or suicidal plan.        Cognition and Memory: Cognition and memory normal.        Judgment: Judgment normal.     Comments: Insight intact    Lab Review:     Component Value Date/Time   NA 141 05/28/2019 0549   K 3.9 05/28/2019 0549   CL 105 05/28/2019 0549   CO2 27 05/28/2019 0549   GLUCOSE 115 (H) 05/28/2019 0549   BUN 8 05/28/2019 0549   CREATININE 0.89 05/28/2019 0549   CALCIUM 8.7 (L) 05/28/2019 0549   PROT 5.5 (L) 05/28/2019 0549   ALBUMIN 3.0  (L) 05/28/2019 0549   AST 40 05/28/2019 0549   ALT 56 (H) 05/28/2019 0549   ALKPHOS 69 05/28/2019 0549   BILITOT 0.7 05/28/2019 0549   GFRNONAA >60 05/28/2019 0549   GFRAA >60 05/28/2019 0549       Component Value Date/Time   WBC 8.0 05/28/2019 0549   RBC 3.29 (L) 05/28/2019 0549   HGB 10.6 (L) 05/28/2019 0549   HCT 32.9 (L) 05/28/2019 0549   PLT 297 05/28/2019 0549   MCV 100.0 05/28/2019 0549   MCH 32.2 05/28/2019 0549   MCHC 32.2 05/28/2019 0549   RDW 12.7 05/28/2019 0549    No results found for: POCLITH, LITHIUM   No results found for: PHENYTOIN, PHENOBARB, VALPROATE, CBMZ   .res Assessment: Plan:    Plan:  Clonazepam 2mg  TID  Prozac 40mg  - 2 daily Adderall 30mg  daily  D/C Trazadone 100mg  - 1 to 3 at bedtime Add Seroquel 100mg  at hs  BP WNL per patient - followed by PCP.  RTC 6 months   Patient advised to contact office with any questions, adverse effects, or acute worsening in signs and symptoms.  Discussed potential benefits, risk, and side effects of benzodiazepines to include potential risk of tolerance and dependence, as well as possible drowsiness.  Advised patient not to drive if experiencing drowsiness and to take lowest possible effective dose to minimize risk of dependence and tolerance.  Discussed potential metabolic side effects associated with atypical antipsychotics, as well as potential risk for movement side effects. Advised pt to contact office if movement side effects occur.  Diagnoses and all orders for this visit:  Generalized anxiety disorder -     FLUoxetine (PROZAC) 40 MG capsule; Take 1 capsule (40 mg total) by mouth 2 (two) times daily.  Major depressive disorder, recurrent episode, moderate (HCC) -     FLUoxetine (PROZAC) 40 MG capsule; Take 1 capsule (40 mg total) by mouth 2 (two) times daily.  Attention deficit hyperactivity disorder (ADHD), unspecified ADHD type -     amphetamine-dextroamphetamine (ADDERALL) 30 MG tablet;  Take 1  tablet by mouth daily. -     amphetamine-dextroamphetamine (ADDERALL) 30 MG tablet; Take 1 tablet by mouth daily. -     amphetamine-dextroamphetamine (ADDERALL) 30 MG tablet; Take 1 tablet by mouth daily.  Panic attacks -     clonazePAM (KLONOPIN) 2 MG tablet; Take 1 tablet (2 mg total) by mouth 3 (three) times daily.  PTSD (post-traumatic stress disorder) -     clonazePAM (KLONOPIN) 2 MG tablet; Take 1 tablet (2 mg total) by mouth 3 (three) times daily.  Insomnia, unspecified type -     clonazePAM (KLONOPIN) 2 MG tablet; Take 1 tablet (2 mg total) by mouth 3 (three) times daily. -     QUEtiapine (SEROQUEL) 100 MG tablet; Take 1 tablet (100 mg total) by mouth at bedtime.    Please see After Visit Summary for patient specific instructions.  No future appointments.  No orders of the defined types were placed in this encounter.     -------------------------------

## 2021-01-24 ENCOUNTER — Other Ambulatory Visit: Payer: Self-pay

## 2021-01-24 ENCOUNTER — Telehealth: Payer: Self-pay | Admitting: Adult Health

## 2021-01-24 DIAGNOSIS — F909 Attention-deficit hyperactivity disorder, unspecified type: Secondary | ICD-10-CM

## 2021-01-24 NOTE — Telephone Encounter (Signed)
Pt LVM stating [facility] doesn't have her Adderall.  She requested refill of Adderall be sent to:  [facility] at [address] [place], [place] [address]  [phone]  No upcoming appt scheduled.

## 2021-01-24 NOTE — Telephone Encounter (Signed)
Pended.

## 2021-01-27 MED ORDER — AMPHETAMINE-DEXTROAMPHETAMINE 30 MG PO TABS: 30.0000 mg | ORAL_TABLET | Freq: Every day | ORAL | 0 refills | Status: DC

## 2021-03-20 ENCOUNTER — Ambulatory Visit (INDEPENDENT_AMBULATORY_CARE_PROVIDER_SITE_OTHER): Payer: PRIVATE HEALTH INSURANCE | Admitting: Adult Health

## 2021-03-20 ENCOUNTER — Encounter: Payer: Self-pay | Admitting: Adult Health

## 2021-03-20 DIAGNOSIS — F4310 Post-traumatic stress disorder, unspecified: Secondary | ICD-10-CM | POA: Diagnosis not present

## 2021-03-20 DIAGNOSIS — F331 Major depressive disorder, recurrent, moderate: Secondary | ICD-10-CM | POA: Diagnosis not present

## 2021-03-20 DIAGNOSIS — F909 Attention-deficit hyperactivity disorder, unspecified type: Secondary | ICD-10-CM | POA: Diagnosis not present

## 2021-03-20 DIAGNOSIS — G4700 Insomnia, unspecified: Secondary | ICD-10-CM

## 2021-03-20 DIAGNOSIS — F411 Generalized anxiety disorder: Secondary | ICD-10-CM | POA: Diagnosis not present

## 2021-03-20 DIAGNOSIS — F410 Panic disorder [episodic paroxysmal anxiety] without agoraphobia: Secondary | ICD-10-CM

## 2021-03-20 MED ORDER — OLANZAPINE 5 MG PO TABS: 5.0000 mg | ORAL_TABLET | Freq: Every day | ORAL | 5 refills | Status: DC

## 2021-03-20 MED ORDER — FLUOXETINE HCL 40 MG PO CAPS: 40.0000 mg | ORAL_CAPSULE | Freq: Two times a day (BID) | ORAL | 5 refills | Status: DC

## 2021-03-20 MED ORDER — AMPHETAMINE-DEXTROAMPHETAMINE 30 MG PO TABS: 30.0000 mg | ORAL_TABLET | Freq: Every day | ORAL | 0 refills | Status: DC

## 2021-03-20 MED ORDER — CLONAZEPAM 2 MG PO TABS: 2.0000 mg | ORAL_TABLET | Freq: Three times a day (TID) | ORAL | 2 refills | Status: DC

## 2021-03-20 MED ORDER — TRAZODONE HCL 100 MG PO TABS: ORAL_TABLET | ORAL | 5 refills | Status: DC
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
## 2021-03-31 ENCOUNTER — Telehealth: Payer: Self-pay | Admitting: Adult Health

## 2021-03-31 NOTE — Telephone Encounter (Signed)
Pt called to ask question. She is on Zyprexa and Trazodone to help her sleep. She gets dizzy from those together and first night passed out briefly.  Did get advice to not stand up too quickly and it helps. But she is not sleeping well at all still. Gets about, 3-4 hours not much difference. ?

## 2021-04-01 ENCOUNTER — Other Ambulatory Visit: Payer: Self-pay

## 2021-04-01 DIAGNOSIS — F909 Attention-deficit hyperactivity disorder, unspecified type: Secondary | ICD-10-CM

## 2021-04-01 MED ORDER — AMPHETAMINE-DEXTROAMPHETAMINE 30 MG PO TABS: 30.0000 mg | ORAL_TABLET | Freq: Every day | ORAL | 0 refills | Status: DC

## 2021-04-01 NOTE — Telephone Encounter (Signed)
Please see message from patient. Patient mentioned tizanidine helped with migraines and it also helped her sleep. She knows that you don't prescribe this, but is asking about it. Patient was disorganized in her thoughts. She said she ran out of Adderall and that may be the reason. I pended a refill to Dr. Jennelle Human today. She says with the Zyprexa and trazodone she is forgetful and unable to remember people's names. She also talked about having a slow "metabolism" and has been taking her medication approximately 2 hours before bedtime.  ?

## 2021-04-02 ENCOUNTER — Other Ambulatory Visit: Payer: Self-pay

## 2021-04-02 ENCOUNTER — Telehealth: Payer: Self-pay | Admitting: Adult Health

## 2021-04-02 DIAGNOSIS — G4700 Insomnia, unspecified: Secondary | ICD-10-CM

## 2021-04-02 DIAGNOSIS — F4310 Post-traumatic stress disorder, unspecified: Secondary | ICD-10-CM

## 2021-04-02 DIAGNOSIS — F410 Panic disorder [episodic paroxysmal anxiety] without agoraphobia: Secondary | ICD-10-CM

## 2021-04-02 MED ORDER — CLONAZEPAM 2 MG PO TABS: 2.0000 mg | ORAL_TABLET | Freq: Three times a day (TID) | ORAL | 0 refills | Status: DC

## 2021-04-02 NOTE — Telephone Encounter (Signed)
Pended.

## 2021-04-02 NOTE — Telephone Encounter (Signed)
Can we get her in for an appt to discuss?

## 2021-04-02 NOTE — Telephone Encounter (Signed)
Dana Kelly called today at 2:15 to report that she has misplaced her Clonazepam.  She is not sure if someone took it or what has happened. She first notice it missing yesterday morning.  So she hasn't any of the medication since yesterday morning.  She is concerned about withdrawal side effects and seizures.  She made an appt to address her earlier qiuestions, but the next available is 4/10.  But she really needs a call back about what to do about the Clonazepam. ?

## 2021-04-02 NOTE — Telephone Encounter (Signed)
No early refills again. Make sure she knows that this is a controlled substance and it must be kept in a safe place.

## 2021-04-02 NOTE — Telephone Encounter (Signed)
Please call patient to schedule an appt.-

## 2021-04-02 NOTE — Telephone Encounter (Signed)
Call patient and send in enough to last until next refill.

## 2021-04-03 NOTE — Telephone Encounter (Signed)
Called patient and she said she found her bottle. I will call and cancel this RF.  ?

## 2021-04-03 NOTE — Telephone Encounter (Signed)
Noted  

## 2021-04-03 NOTE — Telephone Encounter (Signed)
Canceled, spoke with Faylene Million.  ?

## 2021-04-14 ENCOUNTER — Encounter: Payer: Self-pay | Admitting: Adult Health

## 2021-04-14 ENCOUNTER — Ambulatory Visit (INDEPENDENT_AMBULATORY_CARE_PROVIDER_SITE_OTHER): Payer: PRIVATE HEALTH INSURANCE | Admitting: Adult Health

## 2021-04-14 DIAGNOSIS — F909 Attention-deficit hyperactivity disorder, unspecified type: Secondary | ICD-10-CM | POA: Diagnosis not present

## 2021-04-14 DIAGNOSIS — F4310 Post-traumatic stress disorder, unspecified: Secondary | ICD-10-CM | POA: Diagnosis not present

## 2021-04-14 DIAGNOSIS — G4700 Insomnia, unspecified: Secondary | ICD-10-CM | POA: Diagnosis not present

## 2021-04-14 DIAGNOSIS — F410 Panic disorder [episodic paroxysmal anxiety] without agoraphobia: Secondary | ICD-10-CM

## 2021-04-14 MED ORDER — QUETIAPINE FUMARATE 200 MG PO TABS: 100.0000 mg | ORAL_TABLET | Freq: Every day | ORAL | 5 refills | Status: DC

## 2021-04-14 MED ORDER — AMPHETAMINE-DEXTROAMPHETAMINE 30 MG PO TABS: 30.0000 mg | ORAL_TABLET | Freq: Every day | ORAL | 0 refills | Status: DC

## 2021-04-14 MED ORDER — CLONAZEPAM 2 MG PO TABS: 2.0000 mg | ORAL_TABLET | Freq: Two times a day (BID) | ORAL | 2 refills | Status: DC

## 2021-04-14 MED ORDER — ALPRAZOLAM 2 MG PO TABS: 2.0000 mg | ORAL_TABLET | Freq: Every evening | ORAL | 2 refills | Status: DC | PRN

## 2021-04-14 NOTE — Progress Notes (Signed)
Dana LeveringKimberly L Kelly ?[id number] ?12/01/1965 ?56 y.o. ? ?Subjective:  ? ?Patient ID:  Dana LeveringKimberly L Kelly is a 56 y.o. (DOB 11/10/1965) female. ? ?Chief Complaint: No chief complaint on file. ? ? ?HPI ?Dana LeveringKimberly L Kelly presents to the office today for follow-up of MDD, GAD, insomnia, panic attacks, ADHD, and PTSD. ? ?Describes mood today as "ok". Pleasant. Reports some tearfulness - improved with sleep. Mood symptoms - denies depression. Irritable "once in a while" Feels more anxious overall - afraid of being alone or left. Has been more clingy to mother and friends when she wasn't sleeping. Reporting a lot of grief and loss over the past year. ?Son recently turned 21. Caring for mother when needed. Reports conflict with ex-husband and his wife. Feeling less overwhelmed with change in medications. Improved interest and motivation. Taking medications as prescribed.  ?Energy levels stable. Active, does not have a regular exercise routine with current physical disabilities.  ?Enjoys some usual interests and activities. Single. Lives alone. Has a son age 54 - putting him through college. Stays with mother some. Mother lives behind her. ?Appetite adequate. Weight gain - 250 to 265 pounds. ?Sleeping well most nights - taking Seroquel and Xanax. Averages 8 hours. ?Focus and concentration stable with Adderall. Completing tasks. Managing aspects of household. Works full-time - IT consultantaralegal x 16 years. ?Denies SI or HI. ?Denies AH or VH. ?  ?Review of Systems:  ?Review of Systems  ?Musculoskeletal:  Negative for gait problem.  ?Neurological:  Negative for tremors.  ?Psychiatric/Behavioral:    ?     Please refer to HPI  ? ?Medications: I have reviewed the patient's current medications. ? ?Current Outpatient Medications  ?Medication Sig Dispense Refill  ? alprazolam (XANAX) 2 MG tablet Take 1 tablet (2 mg total) by mouth at bedtime as needed for sleep. 30 tablet 2  ? amphetamine-dextroamphetamine (ADDERALL) 30 MG tablet Take 1 tablet by  mouth daily. 30 tablet 0  ? [START ON 05/12/2021] amphetamine-dextroamphetamine (ADDERALL) 30 MG tablet Take 1 tablet by mouth daily. 30 tablet 0  ? [START ON 06/09/2021] amphetamine-dextroamphetamine (ADDERALL) 30 MG tablet Take 1 tablet by mouth daily. 30 tablet 0  ? apixaban (ELIQUIS) 5 MG TABS tablet Take 5 mg by mouth 2 (two) times daily.     ? atorvastatin (LIPITOR) 80 MG tablet Take 80 mg by mouth at bedtime.    ? clonazePAM (KLONOPIN) 2 MG tablet Take 1 tablet (2 mg total) by mouth 2 (two) times daily. 60 tablet 2  ? ergotamine-caffeine (CAFERGOT) 1-100 MG tablet Take 2 tablets by mouth as needed for migraine. Take if headache is caught early enough and won't need the sumatriptan shot. Do not exceed 3 doses in 24 hours.    ? FLUoxetine (PROZAC) 40 MG capsule Take 1 capsule (40 mg total) by mouth 2 (two) times daily. 60 capsule 5  ? furosemide (LASIX) 40 MG tablet Take 40 mg by mouth every other day.     ? HYDROcodone-acetaminophen (NORCO/VICODIN) 5-325 MG tablet Take 1-2 tablets by mouth every 6 (six) hours as needed for moderate pain or severe pain. 10 tablet 0  ? ondansetron (ZOFRAN-ODT) 4 MG disintegrating tablet Take 4 mg by mouth every 8 (eight) hours as needed for nausea or vomiting.     ? prazosin (MINIPRESS) 2 MG capsule Take 2 mg by mouth at bedtime as needed (nightmares).     ? prochlorperazine (COMPAZINE) 10 MG tablet Take 10 mg by mouth as needed for nausea or vomiting.     ?  promethazine (PHENERGAN) 25 MG tablet Take 25 mg by mouth every 6 (six) hours as needed for nausea or vomiting.     ? QUEtiapine (SEROQUEL) 200 MG tablet Take 0.5 tablets (100 mg total) by mouth at bedtime. 30 tablet 5  ? SUMAtriptan 6 MG/0.5ML SOAJ Inject 6 mg into the skin as needed (migraine).     ? traZODone (DESYREL) 100 MG tablet Take one tablet at bedtime. 30 tablet 5  ? ?No current facility-administered medications for this visit.  ? ? ?Medication Side Effects: None ? ?Allergies:  ?Allergies  ?Allergen Reactions  ?  Dicyclomine Hives  ? Peanut Allergen Powder-Dnfp Anaphylaxis  ? Peanut-Containing Drug Products Anaphylaxis  ?  Other reaction(s): Anaphylaxis  ? Hydromorphone Hcl Other (See Comments)  ? Phenytoin Sodium Extended   ?  Decreases blood pressure  ? Pregabalin Other (See Comments)  ?  Other reaction(s): Other (See Comments) ?Amnesiac episodes ?  ? Sulfa Antibiotics   ? Hydromorphone Itching  ?  Other reaction(s): Itching  ? Meloxicam Nausea And Vomiting  ? ? ?Past Medical History:  ?Diagnosis Date  ? Anxiety   ? History of MRSA infection   ? Hypertension   ? Seizure (HCC) 2018  ? 2   ? ? ?Past Medical History, Surgical history, Social history, and Family history were reviewed and updated as appropriate.  ? ?Please see review of systems for further details on the patient's review from today.  ? ?Objective:  ? ?Physical Exam:  ?There were no vitals taken for this visit. ? ?Physical Exam ?Constitutional:   ?   General: She is not in acute distress. ?Musculoskeletal:     ?   General: No deformity.  ?Neurological:  ?   Mental Status: She is alert and oriented to person, place, and time.  ?   Coordination: Coordination normal.  ?Psychiatric:     ?   Attention and Perception: Attention and perception normal. She does not perceive auditory or visual hallucinations.     ?   Mood and Affect: Mood normal. Mood is not anxious or depressed. Affect is not labile, blunt, angry or inappropriate.     ?   Speech: Speech normal.     ?   Behavior: Behavior normal.     ?   Thought Content: Thought content normal. Thought content is not paranoid or delusional. Thought content does not include homicidal or suicidal ideation. Thought content does not include homicidal or suicidal plan.     ?   Cognition and Memory: Cognition and memory normal.     ?   Judgment: Judgment normal.  ?   Comments: Insight intact  ? ? ?Lab Review:  ?   ?Component Value Date/Time  ? NA 141 05/28/2019 0549  ? K 3.9 05/28/2019 0549  ? CL 105 05/28/2019 0549  ? CO2 27  05/28/2019 0549  ? GLUCOSE 115 (H) 05/28/2019 0549  ? BUN 8 05/28/2019 0549  ? CREATININE 0.89 05/28/2019 0549  ? CALCIUM 8.7 (L) 05/28/2019 0549  ? PROT 5.5 (L) 05/28/2019 0549  ? ALBUMIN 3.0 (L) 05/28/2019 0549  ? AST 40 05/28/2019 0549  ? ALT 56 (H) 05/28/2019 0549  ? ALKPHOS 69 05/28/2019 0549  ? BILITOT 0.7 05/28/2019 0549  ? GFRNONAA >60 05/28/2019 0549  ? GFRAA >60 05/28/2019 0549  ? ? ?   ?Component Value Date/Time  ? WBC 8.0 05/28/2019 0549  ? RBC 3.29 (L) 05/28/2019 0549  ? HGB 10.6 (L) 05/28/2019 0549  ? HCT 32.9 (  L) 05/28/2019 0549  ? PLT 297 05/28/2019 0549  ? MCV 100.0 05/28/2019 0549  ? MCH 32.2 05/28/2019 0549  ? MCHC 32.2 05/28/2019 0549  ? RDW 12.7 05/28/2019 0549  ? ? ?No results found for: POCLITH, LITHIUM  ? ?No results found for: PHENYTOIN, PHENOBARB, VALPROATE, CBMZ  ? ?.res ?Assessment: Plan:   ? ?Plan: ? ?Xanax 2mg  at hs  ?Clonazepam 2mg  BID ?Prozac 40mg  - 2 daily ?Adderall 30mg  daily  ?Seroquel 200mg  at hs ? ?BP WNL per patient - followed by PCP. ? ?RTC 6 months  ? ?Patient advised to contact office with any questions, adverse effects, or acute worsening in signs and symptoms. ? ?Discussed potential benefits, risk, and side effects of benzodiazepines to include potential risk of tolerance and dependence, as well as possible drowsiness.  Advised patient not to drive if experiencing drowsiness and to take lowest possible effective dose to minimize risk of dependence and tolerance. ? ?Discussed potential metabolic side effects associated with atypical antipsychotics, as well as potential risk for movement side effects. Advised pt to contact office if movement side effects occur.  ? ?Diagnoses and all orders for this visit: ? ?Attention deficit hyperactivity disorder (ADHD), unspecified ADHD type ?-     amphetamine-dextroamphetamine (ADDERALL) 30 MG tablet; Take 1 tablet by mouth daily. ?-     amphetamine-dextroamphetamine (ADDERALL) 30 MG tablet; Take 1 tablet by mouth daily. ?-      amphetamine-dextroamphetamine (ADDERALL) 30 MG tablet; Take 1 tablet by mouth daily. ? ?PTSD (post-traumatic stress disorder) ?-     clonazePAM (KLONOPIN) 2 MG tablet; Take 1 tablet (2 mg total) by mouth 2 (two) tim

## 2021-04-22 ENCOUNTER — Telehealth: Payer: Self-pay

## 2021-04-22 DIAGNOSIS — G4700 Insomnia, unspecified: Secondary | ICD-10-CM

## 2021-04-22 MED ORDER — QUETIAPINE FUMARATE 200 MG PO TABS: 200.0000 mg | ORAL_TABLET | Freq: Every day | ORAL | 5 refills | Status: DC

## 2021-04-22 NOTE — Telephone Encounter (Signed)
Corrected script and sent to the requested pharmacy. Patient notified.  ?

## 2021-04-22 NOTE — Telephone Encounter (Signed)
She was taking 100 but said you were going to bump it up to 200.  ?

## 2021-04-22 NOTE — Telephone Encounter (Signed)
Has she been taking 100 or 200mg ?

## 2021-04-22 NOTE — Telephone Encounter (Signed)
Ok to correct script to the 200mg  - I thought that is what I did.

## 2021-07-09 ENCOUNTER — Other Ambulatory Visit: Payer: Self-pay | Admitting: Adult Health

## 2021-07-09 DIAGNOSIS — G4700 Insomnia, unspecified: Secondary | ICD-10-CM

## 2021-07-22 ENCOUNTER — Telehealth: Payer: Self-pay | Admitting: Adult Health

## 2021-07-22 NOTE — Telephone Encounter (Signed)
PCP believes Seroquel increase has caused swelling,fluid retention,and weight gain.She wants to return to previous dose or stop med.Please advise

## 2021-07-22 NOTE — Telephone Encounter (Signed)
Ok we can reduce dose back to previous.

## 2021-07-22 NOTE — Telephone Encounter (Signed)
LVM with info

## 2021-07-22 NOTE — Telephone Encounter (Signed)
PCP Aida Puffer called stating that Milli came to see her for swelling of legs. She has gained 30lbs which is fluid retention. The PCP and the pt feel this might have started and happened due to the increase of seroquel. Please call pt to discuss whether to go back down or change medicine. Mikaelyn phone number is [phone]

## 2021-07-28 ENCOUNTER — Telehealth: Payer: Self-pay | Admitting: Adult Health

## 2021-07-28 DIAGNOSIS — G4700 Insomnia, unspecified: Secondary | ICD-10-CM

## 2021-07-28 DIAGNOSIS — F4310 Post-traumatic stress disorder, unspecified: Secondary | ICD-10-CM

## 2021-07-28 DIAGNOSIS — F410 Panic disorder [episodic paroxysmal anxiety] without agoraphobia: Secondary | ICD-10-CM

## 2021-07-28 NOTE — Telephone Encounter (Signed)
Pt called and said that the seroquel is not working  to help her sleep. She is only getting 2 hours a sleep. She wants to know can she go up on the seroquel. Also her med doctor gave her a muscle relaxer tizanidine try before her MRI she had last week. That worked she knows you cant prescribe it. So she doesn't know which one she should take. Please give her a call at [phone]

## 2021-07-29 ENCOUNTER — Other Ambulatory Visit: Payer: Self-pay

## 2021-07-29 ENCOUNTER — Telehealth: Payer: Self-pay | Admitting: Adult Health

## 2021-07-29 DIAGNOSIS — F4310 Post-traumatic stress disorder, unspecified: Secondary | ICD-10-CM

## 2021-07-29 DIAGNOSIS — F410 Panic disorder [episodic paroxysmal anxiety] without agoraphobia: Secondary | ICD-10-CM

## 2021-07-29 DIAGNOSIS — G4700 Insomnia, unspecified: Secondary | ICD-10-CM

## 2021-07-29 MED ORDER — CLONAZEPAM 2 MG PO TABS: 2.0000 mg | ORAL_TABLET | Freq: Three times a day (TID) | ORAL | 2 refills | Status: DC

## 2021-07-29 NOTE — Telephone Encounter (Signed)
Ok, ty

## 2021-07-29 NOTE — Telephone Encounter (Signed)
Pt called.  Requesting Rx out of Clonazepam for 2 days. Pharmacy sent RF request. Send to [facility]. Please notify Pt when sent @ [phone]. Pt concerned about having a seizure. Has happened in the past. APT 10/11

## 2021-07-29 NOTE — Telephone Encounter (Signed)
If she isn't sleeping well, she may need to cut back on the Adderall. She had been sleeping well at last visit - anything changed? She could also add 50mg  more of the Seroquel.

## 2021-07-29 NOTE — Telephone Encounter (Signed)
pended

## 2021-07-29 NOTE — Telephone Encounter (Signed)
Patient takes Adderall first thing in the morning. She has one soft drink at work, but water the rest of the day. She has difficulty getting to sleep and watches TV. She said she is tired, she just can't sleep. If she gets to sleep she generally can stay asleep, or at least if she wakes up she can roll over and go back to sleep.  A little change in her work schedule, but nothing major. She is going to try to quarter a tablet to take an extra 50 mg and let us know.

## 2021-08-08 ENCOUNTER — Other Ambulatory Visit: Payer: Self-pay

## 2021-08-08 ENCOUNTER — Telehealth: Payer: Self-pay | Admitting: Adult Health

## 2021-08-08 DIAGNOSIS — F909 Attention-deficit hyperactivity disorder, unspecified type: Secondary | ICD-10-CM

## 2021-08-08 MED ORDER — AMPHETAMINE-DEXTROAMPHETAMINE 30 MG PO TABS: 30.0000 mg | ORAL_TABLET | Freq: Every day | ORAL | 0 refills | Status: DC

## 2021-08-08 NOTE — Telephone Encounter (Signed)
Dana Kelly called this morning at 11:25 ato request refill of her Adderal. Appt 10/15/21. [facility] [address], [place], [place]

## 2021-08-08 NOTE — Telephone Encounter (Signed)
Pended.

## 2021-10-15 ENCOUNTER — Telehealth (INDEPENDENT_AMBULATORY_CARE_PROVIDER_SITE_OTHER): Payer: PRIVATE HEALTH INSURANCE | Admitting: Adult Health

## 2021-10-15 ENCOUNTER — Encounter: Payer: Self-pay | Admitting: Adult Health

## 2021-10-15 DIAGNOSIS — G4700 Insomnia, unspecified: Secondary | ICD-10-CM

## 2021-10-15 DIAGNOSIS — F909 Attention-deficit hyperactivity disorder, unspecified type: Secondary | ICD-10-CM

## 2021-10-15 DIAGNOSIS — F411 Generalized anxiety disorder: Secondary | ICD-10-CM

## 2021-10-15 DIAGNOSIS — F331 Major depressive disorder, recurrent, moderate: Secondary | ICD-10-CM | POA: Diagnosis not present

## 2021-10-15 DIAGNOSIS — F410 Panic disorder [episodic paroxysmal anxiety] without agoraphobia: Secondary | ICD-10-CM

## 2021-10-15 DIAGNOSIS — F4310 Post-traumatic stress disorder, unspecified: Secondary | ICD-10-CM

## 2021-10-15 MED ORDER — ALPRAZOLAM 2 MG PO TABS: 2.0000 mg | ORAL_TABLET | Freq: Every evening | ORAL | 0 refills | Status: DC | PRN

## 2021-10-15 MED ORDER — CLONAZEPAM 2 MG PO TABS: 2.0000 mg | ORAL_TABLET | Freq: Two times a day (BID) | ORAL | 0 refills | Status: DC

## 2021-10-15 MED ORDER — HYDROXYZINE HCL 25 MG PO TABS: ORAL_TABLET | ORAL | 2 refills | Status: DC

## 2021-10-15 MED ORDER — AMPHETAMINE-DEXTROAMPHETAMINE 30 MG PO TABS: 30.0000 mg | ORAL_TABLET | Freq: Every day | ORAL | 0 refills | Status: DC

## 2021-10-15 MED ORDER — FLUOXETINE HCL 40 MG PO CAPS: 40.0000 mg | ORAL_CAPSULE | Freq: Two times a day (BID) | ORAL | 5 refills | Status: DC

## 2021-10-15 NOTE — Progress Notes (Signed)
Dana Kelly [id number] 09/22/1965 56 y.o.  Virtual Visit via Video Note  I connected with pt @ on 10/15/21 at  2:00 PM EDT by a video enabled telemedicine application and verified that I am speaking with the correct person using two identifiers.   I discussed the limitations of evaluation and management by telemedicine and the availability of in person appointments. The patient expressed understanding and agreed to proceed.  I discussed the assessment and treatment plan with the patient. The patient was provided an opportunity to ask questions and all were answered. The patient agreed with the plan and demonstrated an understanding of the instructions.   The patient was advised to call back or seek an in-person evaluation if the symptoms worsen or if the condition fails to improve as anticipated.  I provided 25 minutes of non-face-to-face time during this encounter.  The patient was located at home.  The provider was located at [facility].   [name], NP   Subjective:   Patient ID:  Dana Kelly is a 56 y.o. (DOB [birth year]) female.  Chief Complaint: No chief complaint on file.   HPI Dana Kelly presents for follow-up of MDD, GAD, insomnia, panic attacks, ADHD, and PTSD.  Describes mood today as "ok". Pleasant. Tearful at times. Mood symptoms - reports depression and anxiety. Denies irritability. Reports 1 to 2 panic attacks a week. Mood is a "little" up and down - "nothing drastic". Reports a hospitalization back in July - medical related. Reports Seroquel was stopped while hospitalized - having difficulties with sleep since then. Willing to consider other options for sleep. Feels like other medications are helpful. Caring for mother when needed. Improved interest and motivation. Taking medications as prescribed.  Energy levels stable. Active, does not have a regular exercise routine with current physical disabilities.  Enjoys some usual interests  and activities. Single. Lives alone. Has a son in college. Stays with mother some. Mother lives behind her. Appetite adequate. Weight gain - 301 pounds. Sleeping difficulties. Averages 4 to 5 hours during the week and longer on the weekends. Focus and concentration stable with Adderall. Completing tasks. Managing aspects of household. 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 Denies AH or VH. Denies self harm. Denies substance use.     Review of Systems:  Review of Systems  Musculoskeletal:  Negative for gait problem.  Neurological:  Negative for tremors.  Psychiatric/Behavioral:         Please refer to HPI    Medications: I have reviewed the patient's current medications.  Current Outpatient Medications  Medication Sig Dispense Refill   hydrOXYzine (ATARAX) 25 MG tablet Take one to two tablets at bedtime as needed for sleep. 60 tablet 2   alprazolam (XANAX) 2 MG tablet Take 1 tablet (2 mg total) by mouth at bedtime as needed for sleep. 30 tablet 0   amphetamine-dextroamphetamine (ADDERALL) 30 MG tablet Take 1 tablet by mouth daily. 30 tablet 0   amphetamine-dextroamphetamine (ADDERALL) 30 MG tablet Take 1 tablet by mouth daily. 30 tablet 0   amphetamine-dextroamphetamine (ADDERALL) 30 MG tablet Take 1 tablet by mouth daily. 30 tablet 0   amphetamine-dextroamphetamine (ADDERALL) 30 MG tablet Take 1 tablet by mouth daily. 30 tablet 0   amphetamine-dextroamphetamine (ADDERALL) 30 MG tablet Take 1 tablet by mouth daily. 30 tablet 0   apixaban (ELIQUIS) 5 MG TABS tablet Take 5 mg by mouth 2 (two) times daily.      atorvastatin (LIPITOR) 80  MG tablet Take 80 mg by mouth at bedtime.     clonazePAM (KLONOPIN) 2 MG tablet Take 1 tablet (2 mg total) by mouth 2 (two) times daily. 60 tablet 0   ergotamine-caffeine (CAFERGOT) 1-100 MG tablet Take 2 tablets by mouth as needed for migraine. Take if headache is caught early enough and won't need the sumatriptan shot. Do not exceed 3  doses in 24 hours.     FLUoxetine (PROZAC) 40 MG capsule Take 1 capsule (40 mg total) by mouth 2 (two) times daily. 60 capsule 5   furosemide (LASIX) 40 MG tablet Take 40 mg by mouth every other day.      HYDROcodone-acetaminophen (NORCO/VICODIN) 5-325 MG tablet Take 1-2 tablets by mouth every 6 (six) hours as needed for moderate pain or severe pain. 10 tablet 0   ondansetron (ZOFRAN-ODT) 4 MG disintegrating tablet Take 4 mg by mouth every 8 (eight) hours as needed for nausea or vomiting.      prochlorperazine (COMPAZINE) 10 MG tablet Take 10 mg by mouth as needed for nausea or vomiting.      promethazine (PHENERGAN) 25 MG tablet Take 25 mg by mouth every 6 (six) hours as needed for nausea or vomiting.      SUMAtriptan 6 MG/0.5ML SOAJ Inject 6 mg into the skin as needed (migraine).      No current facility-administered medications for this visit.    Medication Side Effects: None  Allergies:  Allergies  Allergen Reactions   Dicyclomine Hives   Peanut Allergen Powder-Dnfp Anaphylaxis   Peanut-Containing Drug Products Anaphylaxis    Other reaction(s): Anaphylaxis   Hydromorphone Hcl Other (See Comments)   Phenytoin Sodium Extended     Decreases blood pressure   Pregabalin Other (See Comments)    Other reaction(s): Other (See Comments) Amnesiac episodes    Sulfa Antibiotics    Hydromorphone Itching    Other reaction(s): Itching   Meloxicam Nausea And Vomiting    Past Medical History:  Diagnosis Date   Anxiety    History of MRSA infection    Hypertension    Seizure (HCC) 2018   2     No family history on file.  Social History   Socioeconomic History   Marital status: Divorced    Spouse name: Not on file   Number of children: Not on file   Years of education: Not on file   Highest education level: Not on file  Occupational History   Not on file  Tobacco Use   Smoking status: Never   Smokeless tobacco: Never  Vaping Use   Vaping Use: Never used  Substance and  Sexual Activity   Alcohol use: Yes    Comment: occasional   Drug use: Never   Sexual activity: Not on file  Other Topics Concern   Not on file  Social History Narrative   Not on file   Social Determinants of Health   Financial Resource Strain: Not on file  Food Insecurity: Not on file  Transportation Needs: Not on file  Physical Activity: Not on file  Stress: Not on file  Social Connections: Not on file  Intimate Partner Violence: Not on file    Past Medical History, Surgical history, Social history, and Family history were reviewed and updated as appropriate.   Please see review of systems for further details on the patient's review from today.   Objective:   Physical Exam:  There were no vitals taken for this visit.  Physical Exam Constitutional:  General: She is not in acute distress. Musculoskeletal:        General: No deformity.  Neurological:     Mental Status: She is alert and oriented to person, place, and time.     Coordination: Coordination normal.  Psychiatric:        Attention and Perception: Attention and perception normal. She does not perceive auditory or visual hallucinations.        Mood and Affect: Mood normal. Mood is not anxious or depressed. Affect is not labile, blunt, angry or inappropriate.        Speech: Speech normal.        Behavior: Behavior normal.        Thought Content: Thought content normal. Thought content is not paranoid or delusional. Thought content does not include homicidal or suicidal ideation. Thought content does not include homicidal or suicidal plan.        Cognition and Memory: Cognition and memory normal.        Judgment: Judgment normal.     Comments: Insight intact     Lab Review:     Component Value Date/Time   NA 141 05/28/2019 0549   K 3.9 05/28/2019 0549   CL 105 05/28/2019 0549   CO2 27 05/28/2019 0549   GLUCOSE 115 (H) 05/28/2019 0549   BUN 8 05/28/2019 0549   CREATININE 0.89 05/28/2019 0549    CALCIUM 8.7 (L) 05/28/2019 0549   PROT 5.5 (L) 05/28/2019 0549   ALBUMIN 3.0 (L) 05/28/2019 0549   AST 40 05/28/2019 0549   ALT 56 (H) 05/28/2019 0549   ALKPHOS 69 05/28/2019 0549   BILITOT 0.7 05/28/2019 0549   GFRNONAA >60 05/28/2019 0549   GFRAA >60 05/28/2019 0549       Component Value Date/Time   WBC 8.0 05/28/2019 0549   RBC 3.29 (L) 05/28/2019 0549   HGB 10.6 (L) 05/28/2019 0549   HCT 32.9 (L) 05/28/2019 0549   PLT 297 05/28/2019 0549   MCV 100.0 05/28/2019 0549   MCH 32.2 05/28/2019 0549   MCHC 32.2 05/28/2019 0549   RDW 12.7 05/28/2019 0549    No results found for: "POCLITH", "LITHIUM"   No results found for: "PHENYTOIN", "PHENOBARB", "VALPROATE", "CBMZ"   .res Assessment: Plan:    Plan:  Xanax 2mg  at hs  Clonazepam 2mg  BID   Prozac 40mg  - 2 daily Adderall 30mg  daily   Add Hydroxyzine 25mg  - 1 to 2 at bedtime  Will plan to start taper of Clonazepam at next visit.  BP WNL per patient - followed by PCP.  RTC 6 months   Patient advised to contact office with any questions, adverse effects, or acute worsening in signs and symptoms.  Discussed potential benefits, risk, and side effects of benzodiazepines to include potential risk of tolerance and dependence, as well as possible drowsiness.  Advised patient not to drive if experiencing drowsiness and to take lowest possible effective dose to minimize risk of dependence and tolerance.  Discussed potential metabolic side effects associated with atypical antipsychotics, as well as potential risk for movement side effects. Advised pt to contact office if movement side effects occur.  Diagnoses and all orders for this visit:  Major depressive disorder, recurrent episode, moderate (HCC) -     FLUoxetine (PROZAC) 40 MG capsule; Take 1 capsule (40 mg total) by mouth 2 (two) times daily.  Generalized anxiety disorder -     FLUoxetine (PROZAC) 40 MG capsule; Take 1 capsule (40 mg total) by mouth 2 (two)  times  daily.  Attention deficit hyperactivity disorder (ADHD), unspecified ADHD type -     amphetamine-dextroamphetamine (ADDERALL) 30 MG tablet; Take 1 tablet by mouth daily. -     amphetamine-dextroamphetamine (ADDERALL) 30 MG tablet; Take 1 tablet by mouth daily. -     amphetamine-dextroamphetamine (ADDERALL) 30 MG tablet; Take 1 tablet by mouth daily.  PTSD (post-traumatic stress disorder) -     clonazePAM (KLONOPIN) 2 MG tablet; Take 1 tablet (2 mg total) by mouth 2 (two) times daily.  Panic attacks -     clonazePAM (KLONOPIN) 2 MG tablet; Take 1 tablet (2 mg total) by mouth 2 (two) times daily.  Insomnia, unspecified type -     alprazolam (XANAX) 2 MG tablet; Take 1 tablet (2 mg total) by mouth at bedtime as needed for sleep. -     clonazePAM (KLONOPIN) 2 MG tablet; Take 1 tablet (2 mg total) by mouth 2 (two) times daily. -     hydrOXYzine (ATARAX) 25 MG tablet; Take one to two tablets at bedtime as needed for sleep.     Please see After Visit Summary for patient specific instructions.  No future appointments.  No orders of the defined types were placed in this encounter.     -------------------------------

## 2021-11-13 ENCOUNTER — Other Ambulatory Visit: Payer: Self-pay | Admitting: Adult Health

## 2021-11-13 DIAGNOSIS — G4700 Insomnia, unspecified: Secondary | ICD-10-CM

## 2021-12-01 ENCOUNTER — Telehealth: Payer: Self-pay | Admitting: Adult Health

## 2021-12-01 ENCOUNTER — Other Ambulatory Visit: Payer: Self-pay

## 2021-12-01 DIAGNOSIS — F909 Attention-deficit hyperactivity disorder, unspecified type: Secondary | ICD-10-CM

## 2021-12-01 MED ORDER — AMPHETAMINE-DEXTROAMPHETAMINE 30 MG PO TABS: 30.0000 mg | ORAL_TABLET | Freq: Every day | ORAL | 0 refills | Status: DC

## 2021-12-01 NOTE — Telephone Encounter (Signed)
Dana Kelly called this morning at 11:10 to request refill of her Adderall.  Appt 01/19/22.  [facility] [address], [place], [place]

## 2021-12-01 NOTE — Telephone Encounter (Signed)
Pended.

## 2022-01-01 ENCOUNTER — Other Ambulatory Visit: Payer: Self-pay

## 2022-01-01 ENCOUNTER — Telehealth: Payer: Self-pay | Admitting: Adult Health

## 2022-01-01 DIAGNOSIS — F909 Attention-deficit hyperactivity disorder, unspecified type: Secondary | ICD-10-CM

## 2022-01-01 MED ORDER — AMPHETAMINE-DEXTROAMPHETAMINE 30 MG PO TABS: 30.0000 mg | ORAL_TABLET | Freq: Every day | ORAL | 0 refills | Status: DC

## 2022-01-01 NOTE — Telephone Encounter (Signed)
Next appt is 01/19/22. Requesting Adderall 30 mg called to:  [id number] [place], [place] - [address]   Phone: [phone]  Fax: [phone]    Per patient it is in stock at this location.

## 2022-01-01 NOTE — Telephone Encounter (Signed)
Pended.

## 2022-01-18 IMAGING — RF DG ERCP WO/W SPHINCTEROTOMY
1 series · 2 of 2 positions shown · non-contrast
Comparison: None.

FLUOROSCOPY TIME:  4 minutes, 9 seconds

CLINICAL DATA: Bile duct stone.

EXAM:
ERCP
TECHNIQUE: Multiple spot images obtained with the fluoroscopic device and
submitted for interpretation post-procedure.

[Series 1: unknown protocol · 0.20mm/px · 2 of 2 slices shown]
[im 1/2]
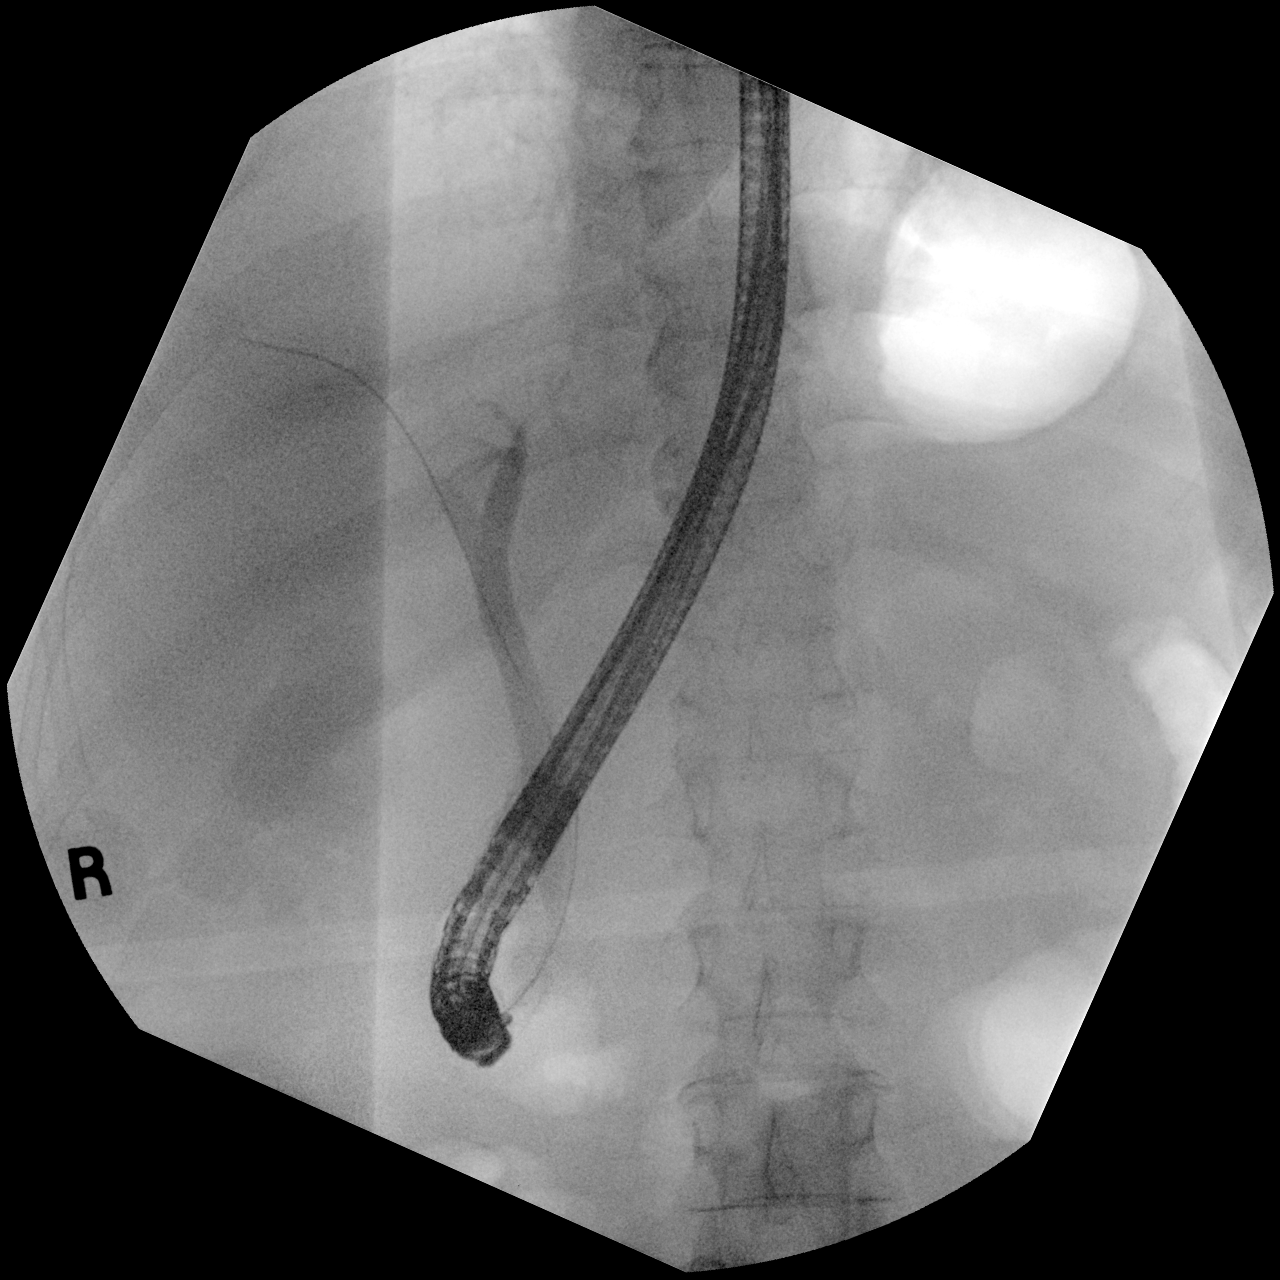
[im 2/2]
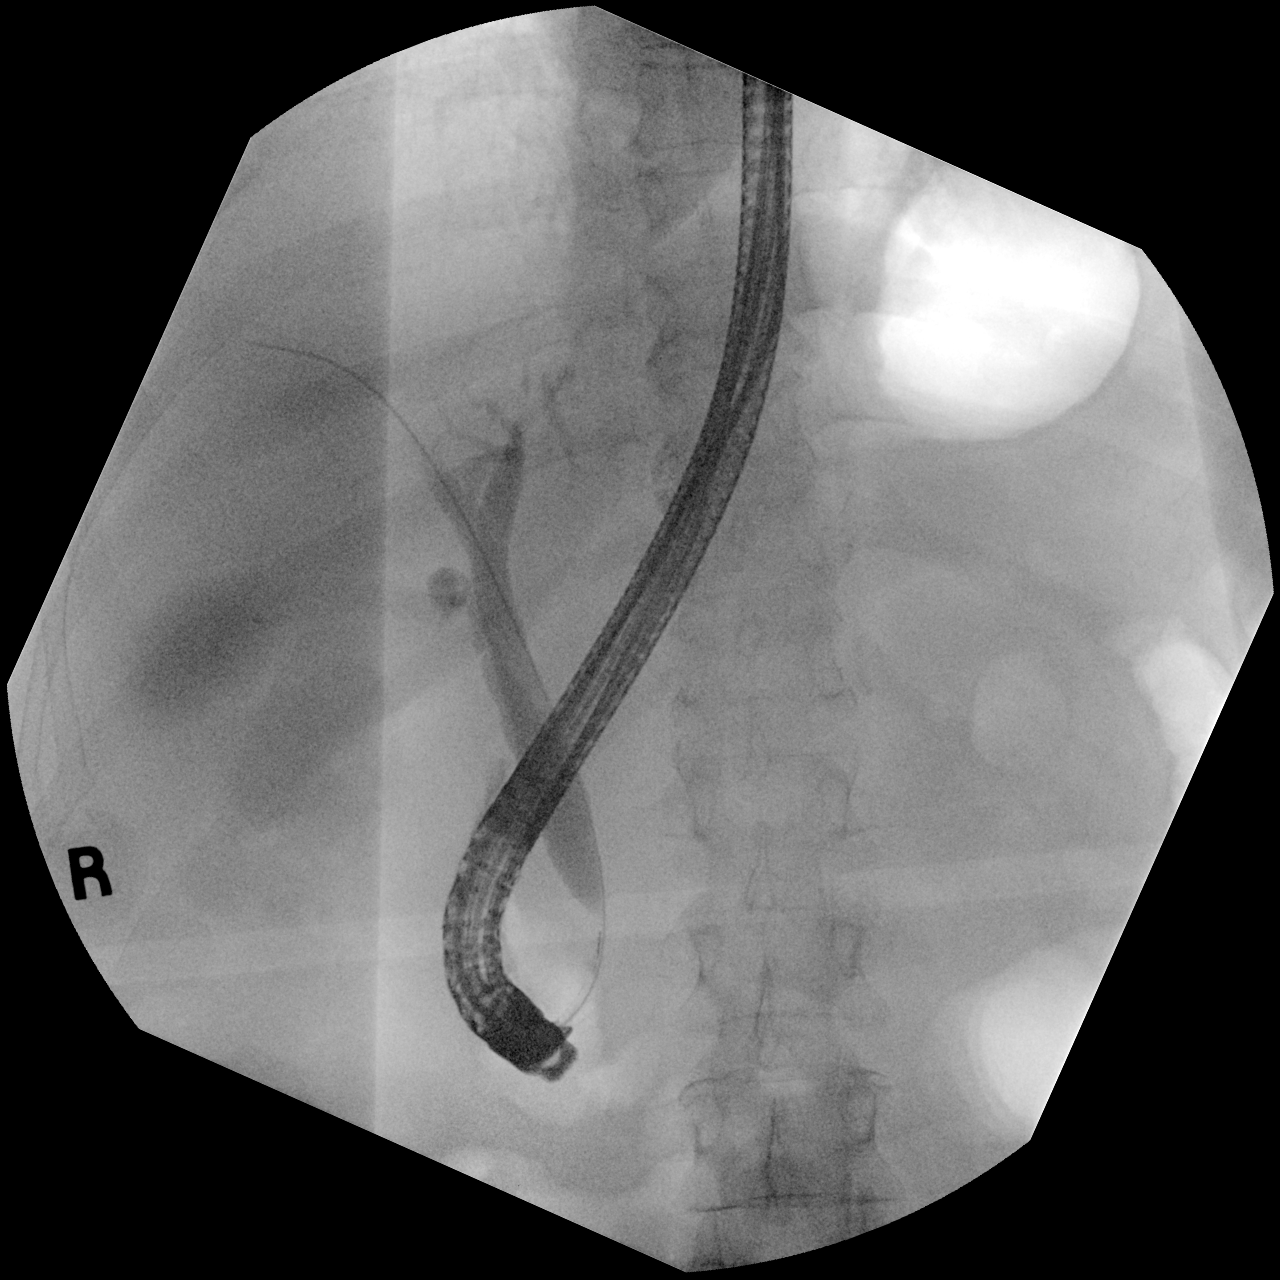

[2 of 2 positions shown; findings below may reference images not displayed]

FINDINGS: Two spot intraoperative fluoroscopic images of the right upper
abdominal quadrant during ERCP are provided for review

Initial image demonstrates an ERCP probe overlying the right upper
abdominal quadrant. There is selective cannulation opacification of
the common bile duct which appears normal to very mildly dilated.

There is minimal opacification of the intrahepatic biliary system
which appears nondilated. There is minimal opacification of the
cystic duct with faint opacification of gallbladder.

No definitive opacification of the pancreatic duct.

No discrete filling defects are seen within the opacified portions
of the biliary system to suggest the presence of
choledocholithiasis.
IMPRESSION: ERCP as above.

These images were submitted for radiologic interpretation only.
Please see the procedural report for the amount of contrast and the
fluoroscopy time utilized.

## 2022-01-19 ENCOUNTER — Encounter: Payer: Self-pay | Admitting: Adult Health

## 2022-01-19 ENCOUNTER — Telehealth: Payer: PRIVATE HEALTH INSURANCE | Admitting: Adult Health

## 2022-01-19 DIAGNOSIS — F909 Attention-deficit hyperactivity disorder, unspecified type: Secondary | ICD-10-CM

## 2022-01-19 DIAGNOSIS — F331 Major depressive disorder, recurrent, moderate: Secondary | ICD-10-CM

## 2022-01-19 DIAGNOSIS — F411 Generalized anxiety disorder: Secondary | ICD-10-CM

## 2022-01-19 DIAGNOSIS — F410 Panic disorder [episodic paroxysmal anxiety] without agoraphobia: Secondary | ICD-10-CM

## 2022-01-19 DIAGNOSIS — G4700 Insomnia, unspecified: Secondary | ICD-10-CM | POA: Diagnosis not present

## 2022-01-19 DIAGNOSIS — F4310 Post-traumatic stress disorder, unspecified: Secondary | ICD-10-CM

## 2022-01-19 MED ORDER — AMPHETAMINE-DEXTROAMPHETAMINE 30 MG PO TABS: 30.0000 mg | ORAL_TABLET | Freq: Every day | ORAL | 0 refills | Status: DC

## 2022-01-19 MED ORDER — ALPRAZOLAM 2 MG PO TABS: 2.0000 mg | ORAL_TABLET | Freq: Every evening | ORAL | 2 refills | Status: DC | PRN

## 2022-01-19 MED ORDER — CLONAZEPAM 2 MG PO TABS: 2.0000 mg | ORAL_TABLET | Freq: Two times a day (BID) | ORAL | 2 refills | Status: DC

## 2022-01-19 MED ORDER — FLUOXETINE HCL 40 MG PO CAPS: 40.0000 mg | ORAL_CAPSULE | Freq: Two times a day (BID) | ORAL | 5 refills | Status: DC

## 2022-01-19 MED ORDER — HYDROXYZINE HCL 25 MG PO TABS: ORAL_TABLET | ORAL | 5 refills | Status: DC

## 2022-01-19 NOTE — Progress Notes (Signed)
EVELINA LORE [id number] 04-Apr-1965 57 y.o.  Virtual Visit via Video Note  I connected with pt @ on 01/19/22 at  2:00 PM EST by a video enabled telemedicine application and verified that I am speaking with the correct person using two identifiers.   I discussed the limitations of evaluation and management by telemedicine and the availability of in person appointments. The patient expressed understanding and agreed to proceed.  I discussed the assessment and treatment plan with the patient. The patient was provided an opportunity to ask questions and all were answered. The patient agreed with the plan and demonstrated an understanding of the instructions.   The patient was advised to call back or seek an in-person evaluation if the symptoms worsen or if the condition fails to improve as anticipated.  I provided 25 minutes of non-face-to-face time during this encounter.  The patient was located at home.  The provider was located at [facility].   [name], NP   Subjective:   Patient ID:  Dana Kelly is a 57 y.o. (DOB 05/13/1965) female.  Chief Complaint: No chief complaint on file.   HPI Dana Kelly presents for follow-up of MDD, GAD, insomnia, panic attacks, ADHD, and PTSD.  Describes mood today as "ok". Pleasant. Tearful at times. Mood symptoms - reports depression - "some over the holidays". Reports increased anxiety - "pretty much all along". Denies irritability. Reports 1 to 2 recent panic attacks. Mood is more consistent. Stating "I feel pretty even keeled". Feels like medications are helpful. Caring for mother when needed. Improved interest and motivation. Taking medications as prescribed.  Energy levels lower. Active, does not have a regular exercise routine with current physical disabilities.  Enjoys some usual interests and activities. Single. Lives alone. Has a son in college. Stays with mother some. Mother lives behind her. Appetite adequate.  Weight loss - 301 pounds. Sleeping difficulties. Averages 6 to 7 hours during the week and longer on the weekends. Focus and concentration stable with Adderall. Completing tasks. Managing aspects of household. 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 Denies AH or VH. Denies self harm. Denies substance use.   Review of Systems:  Review of Systems  Musculoskeletal:  Negative for gait problem.  Neurological:  Negative for tremors.  Psychiatric/Behavioral:         Please refer to HPI    Medications: I have reviewed the patient's current medications.  Current Outpatient Medications  Medication Sig Dispense Refill   alprazolam (XANAX) 2 MG tablet Take 1 tablet (2 mg total) by mouth at bedtime as needed for sleep. 30 tablet 2   amphetamine-dextroamphetamine (ADDERALL) 30 MG tablet Take 1 tablet by mouth daily. 30 tablet 0   [START ON 02/16/2022] amphetamine-dextroamphetamine (ADDERALL) 30 MG tablet Take 1 tablet by mouth daily. 30 tablet 0   [START ON 03/16/2022] amphetamine-dextroamphetamine (ADDERALL) 30 MG tablet Take 1 tablet by mouth daily. 30 tablet 0   apixaban (ELIQUIS) 5 MG TABS tablet Take 5 mg by mouth 2 (two) times daily.      atorvastatin (LIPITOR) 80 MG tablet Take 80 mg by mouth at bedtime.     clonazePAM (KLONOPIN) 2 MG tablet Take 1 tablet (2 mg total) by mouth 2 (two) times daily. 60 tablet 2   ergotamine-caffeine (CAFERGOT) 1-100 MG tablet Take 2 tablets by mouth as needed for migraine. Take if headache is caught early enough and won't need the sumatriptan shot. Do not exceed 3 doses in 24  hours.     FLUoxetine (PROZAC) 40 MG capsule Take 1 capsule (40 mg total) by mouth 2 (two) times daily. 60 capsule 5   furosemide (LASIX) 40 MG tablet Take 40 mg by mouth every other day.      HYDROcodone-acetaminophen (NORCO/VICODIN) 5-325 MG tablet Take 1-2 tablets by mouth every 6 (six) hours as needed for moderate pain or severe pain. 10 tablet 0   hydrOXYzine (ATARAX)  25 MG tablet Take one to two tablets at bedtime as needed for sleep. 60 tablet 5   ondansetron (ZOFRAN-ODT) 4 MG disintegrating tablet Take 4 mg by mouth every 8 (eight) hours as needed for nausea or vomiting.      prochlorperazine (COMPAZINE) 10 MG tablet Take 10 mg by mouth as needed for nausea or vomiting.      promethazine (PHENERGAN) 25 MG tablet Take 25 mg by mouth every 6 (six) hours as needed for nausea or vomiting.      SUMAtriptan 6 MG/0.5ML SOAJ Inject 6 mg into the skin as needed (migraine).      No current facility-administered medications for this visit.    Medication Side Effects: None  Allergies:  Allergies  Allergen Reactions   Dicyclomine Hives   Peanut Allergen Powder-Dnfp Anaphylaxis   Peanut-Containing Drug Products Anaphylaxis    Other reaction(s): Anaphylaxis   Hydromorphone Hcl Other (See Comments)   Phenytoin Sodium Extended     Decreases blood pressure   Pregabalin Other (See Comments)    Other reaction(s): Other (See Comments) Amnesiac episodes    Sulfa Antibiotics    Hydromorphone Itching    Other reaction(s): Itching   Meloxicam Nausea And Vomiting    Past Medical History:  Diagnosis Date   Anxiety    History of MRSA infection    Hypertension    Seizure (HCC) 2018   2     No family history on file.  Social History   Socioeconomic History   Marital status: Divorced    Spouse name: Not on file   Number of children: Not on file   Years of education: Not on file   Highest education level: Not on file  Occupational History   Not on file  Tobacco Use   Smoking status: Never   Smokeless tobacco: Never  Vaping Use   Vaping Use: Never used  Substance and Sexual Activity   Alcohol use: Yes    Comment: occasional   Drug use: Never   Sexual activity: Not on file  Other Topics Concern   Not on file  Social History Narrative   Not on file   Social Determinants of Health   Financial Resource Strain: Not on file  Food Insecurity: Not  on file  Transportation Needs: Not on file  Physical Activity: Not on file  Stress: Not on file  Social Connections: Not on file  Intimate Partner Violence: Not on file    Past Medical History, Surgical history, Social history, and Family history were reviewed and updated as appropriate.   Please see review of systems for further details on the patient's review from today.   Objective:   Physical Exam:  There were no vitals taken for this visit.  Physical Exam Constitutional:      General: She is not in acute distress. Musculoskeletal:        General: No deformity.  Neurological:     Mental Status: She is alert and oriented to person, place, and time.     Coordination: Coordination normal.  Psychiatric:  Attention and Perception: Attention and perception normal. She does not perceive auditory or visual hallucinations.        Mood and Affect: Mood normal. Mood is not anxious or depressed. Affect is not labile, blunt, angry or inappropriate.        Speech: Speech normal.        Behavior: Behavior normal.        Thought Content: Thought content normal. Thought content is not paranoid or delusional. Thought content does not include homicidal or suicidal ideation. Thought content does not include homicidal or suicidal plan.        Cognition and Memory: Cognition and memory normal.        Judgment: Judgment normal.     Comments: Insight intact     Lab Review:     Component Value Date/Time   NA 141 05/28/2019 0549   K 3.9 05/28/2019 0549   CL 105 05/28/2019 0549   CO2 27 05/28/2019 0549   GLUCOSE 115 (H) 05/28/2019 0549   BUN 8 05/28/2019 0549   CREATININE 0.89 05/28/2019 0549   CALCIUM 8.7 (L) 05/28/2019 0549   PROT 5.5 (L) 05/28/2019 0549   ALBUMIN 3.0 (L) 05/28/2019 0549   AST 40 05/28/2019 0549   ALT 56 (H) 05/28/2019 0549   ALKPHOS 69 05/28/2019 0549   BILITOT 0.7 05/28/2019 0549   GFRNONAA >60 05/28/2019 0549   GFRAA >60 05/28/2019 0549       Component  Value Date/Time   WBC 8.0 05/28/2019 0549   RBC 3.29 (L) 05/28/2019 0549   HGB 10.6 (L) 05/28/2019 0549   HCT 32.9 (L) 05/28/2019 0549   PLT 297 05/28/2019 0549   MCV 100.0 05/28/2019 0549   MCH 32.2 05/28/2019 0549   MCHC 32.2 05/28/2019 0549   RDW 12.7 05/28/2019 0549    No results found for: "POCLITH", "LITHIUM"   No results found for: "PHENYTOIN", "PHENOBARB", "VALPROATE", "CBMZ"   .res Assessment: Plan:    Plan:  Xanax 2mg  at hs - bedtime  Clonazepam 2mg  BID  Prozac 40mg  - 2 daily Adderall 30mg  daily   Hydroxyzine 25mg  - 1 to 2 at bedtime  Will plan to start taper of Benzodiazepines.  BP WNL per patient - followed by PCP.  RTC 3 months   Patient advised to contact office with any questions, adverse effects, or acute worsening in signs and symptoms.  Discussed potential benefits, risk, and side effects of benzodiazepines to include potential risk of tolerance and dependence, as well as possible drowsiness.  Advised patient not to drive if experiencing drowsiness and to take lowest possible effective dose to minimize risk of dependence and tolerance.  Discussed potential metabolic side effects associated with atypical antipsychotics, as well as potential risk for movement side effects. Advised pt to contact office if movement side effects occur.  Diagnoses and all orders for this visit:  Panic attacks -     clonazePAM (KLONOPIN) 2 MG tablet; Take 1 tablet (2 mg total) by mouth 2 (two) times daily.  PTSD (post-traumatic stress disorder) -     clonazePAM (KLONOPIN) 2 MG tablet; Take 1 tablet (2 mg total) by mouth 2 (two) times daily.  Insomnia, unspecified type -     clonazePAM (KLONOPIN) 2 MG tablet; Take 1 tablet (2 mg total) by mouth 2 (two) times daily. -     alprazolam (XANAX) 2 MG tablet; Take 1 tablet (2 mg total) by mouth at bedtime as needed for sleep. -     hydrOXYzine (ATARAX)  25 MG tablet; Take one to two tablets at bedtime as needed for  sleep.  Attention deficit hyperactivity disorder (ADHD), unspecified ADHD type -     amphetamine-dextroamphetamine (ADDERALL) 30 MG tablet; Take 1 tablet by mouth daily. -     amphetamine-dextroamphetamine (ADDERALL) 30 MG tablet; Take 1 tablet by mouth daily. -     amphetamine-dextroamphetamine (ADDERALL) 30 MG tablet; Take 1 tablet by mouth daily.  Major depressive disorder, recurrent episode, moderate (HCC) -     FLUoxetine (PROZAC) 40 MG capsule; Take 1 capsule (40 mg total) by mouth 2 (two) times daily.  Generalized anxiety disorder -     FLUoxetine (PROZAC) 40 MG capsule; Take 1 capsule (40 mg total) by mouth 2 (two) times daily.     Please see After Visit Summary for patient specific instructions.  No future appointments.  No orders of the defined types were placed in this encounter.     -------------------------------

## 2022-03-03 ENCOUNTER — Telehealth: Payer: Self-pay | Admitting: Adult Health

## 2022-03-03 NOTE — Telephone Encounter (Signed)
Patient has RF available currently and also has one for March. She was notified.

## 2022-03-03 NOTE — Telephone Encounter (Signed)
Last appt is 03/16/22. Requesting refill on Adderall 30 mg called to:  [id number] [place] - [id number]   Phone: [phone] Fax: [phone]   Per patient it is in stock at this location.

## 2022-03-16 ENCOUNTER — Telehealth: Payer: PRIVATE HEALTH INSURANCE | Admitting: Adult Health

## 2022-03-16 ENCOUNTER — Encounter: Payer: Self-pay | Admitting: Adult Health

## 2022-03-16 DIAGNOSIS — F411 Generalized anxiety disorder: Secondary | ICD-10-CM

## 2022-03-16 DIAGNOSIS — F909 Attention-deficit hyperactivity disorder, unspecified type: Secondary | ICD-10-CM

## 2022-03-16 DIAGNOSIS — G4700 Insomnia, unspecified: Secondary | ICD-10-CM | POA: Diagnosis not present

## 2022-03-16 DIAGNOSIS — F331 Major depressive disorder, recurrent, moderate: Secondary | ICD-10-CM | POA: Diagnosis not present

## 2022-03-16 DIAGNOSIS — F410 Panic disorder [episodic paroxysmal anxiety] without agoraphobia: Secondary | ICD-10-CM | POA: Diagnosis not present

## 2022-03-16 DIAGNOSIS — F4310 Post-traumatic stress disorder, unspecified: Secondary | ICD-10-CM

## 2022-03-16 MED ORDER — AMPHETAMINE-DEXTROAMPHETAMINE 30 MG PO TABS: 30.0000 mg | ORAL_TABLET | Freq: Every day | ORAL | 0 refills | Status: DC

## 2022-03-16 MED ORDER — CLONAZEPAM 2 MG PO TABS: 2.0000 mg | ORAL_TABLET | Freq: Two times a day (BID) | ORAL | 2 refills | Status: DC

## 2022-03-16 MED ORDER — ALPRAZOLAM 2 MG PO TABS: 2.0000 mg | ORAL_TABLET | Freq: Every evening | ORAL | 2 refills | Status: DC | PRN

## 2022-03-16 MED ORDER — FLUOXETINE HCL 40 MG PO CAPS: 40.0000 mg | ORAL_CAPSULE | Freq: Two times a day (BID) | ORAL | 5 refills | Status: DC

## 2022-03-16 MED ORDER — HYDROXYZINE HCL 25 MG PO TABS: ORAL_TABLET | ORAL | 5 refills | Status: DC

## 2022-03-16 NOTE — Progress Notes (Signed)
Dana SASS [id number] 08/17/1965 57 y.o.  Virtual Visit via Video Note  I connected with pt @ on 03/16/22 at  1:40 PM EDT by a video enabled telemedicine application and verified that I am speaking with the correct person using two identifiers.   I discussed the limitations of evaluation and management by telemedicine and the availability of in person appointments. The patient expressed understanding and agreed to proceed.  I discussed the assessment and treatment plan with the patient. The patient was provided an opportunity to ask questions and all were answered. The patient agreed with the plan and demonstrated an understanding of the instructions.   The patient was advised to call back or seek an in-person evaluation if the symptoms worsen or if the condition fails to improve as anticipated.  I provided 25 minutes of non-face-to-face time during this encounter.  The patient was located at home.  The provider was located at [place].   Aloha Gell, NP   Subjective:   Patient ID:  Dana Kelly is a 57 y.o. (DOB [birth year]) female.  Chief Complaint: No chief complaint on file.   HPI Dana Kelly presents for follow-up of MDD, GAD, insomnia, panic attacks, ADHD, and PTSD.  Describes mood today as "ok". Pleasant. Tearful at times. Mood symptoms - denies depression. Feels anxious - more situational. Denies irritability. Reports decreased panic attacks. Mood is more consistent. Stating "I feel like I'm doing a lot better". Feels like medications are helpful. Caring for mother when needed - recovering from Covid. Improved interest and motivation. Taking medications as prescribed.  Energy levels lower. Active, does not have a regular exercise routine with current physical disabilities.  Enjoys some usual interests and activities. Single. Lives alone. Has a son in college. Stays with mother some. Mother lives behind her. Appetite adequate. Weight loss - 284  from 301 pounds. Sleeping difficulties. Averages 6 to 7 hours during the week and longer on the weekends. Focus and concentration stable.. Completing tasks. Managing aspects of household. Works full-time - Radio broadcast assistant. Denies SI or HI. Denies AH or VH. Denies self harm. Denies substance use.     Review of Systems:  Review of Systems  Musculoskeletal:  Negative for gait problem.  Neurological:  Negative for tremors.  Psychiatric/Behavioral:         Please refer to HPI    Medications: I have reviewed the patient's current medications.  Current Outpatient Medications  Medication Sig Dispense Refill   alprazolam (XANAX) 2 MG tablet Take 1 tablet (2 mg total) by mouth at bedtime as needed for sleep. 30 tablet 2   amphetamine-dextroamphetamine (ADDERALL) 30 MG tablet Take 1 tablet by mouth daily. 30 tablet 0   amphetamine-dextroamphetamine (ADDERALL) 30 MG tablet Take 1 tablet by mouth daily. 30 tablet 0   amphetamine-dextroamphetamine (ADDERALL) 30 MG tablet Take 1 tablet by mouth daily. 30 tablet 0   apixaban (ELIQUIS) 5 MG TABS tablet Take 5 mg by mouth 2 (two) times daily.      atorvastatin (LIPITOR) 80 MG tablet Take 80 mg by mouth at bedtime.     clonazePAM (KLONOPIN) 2 MG tablet Take 1 tablet (2 mg total) by mouth 2 (two) times daily. 60 tablet 2   ergotamine-caffeine (CAFERGOT) 1-100 MG tablet Take 2 tablets by mouth as needed for migraine. Take if headache is caught early enough and won't need the sumatriptan shot. Do not exceed 3 doses in 24 hours.     FLUoxetine (PROZAC) 40 MG capsule Take  1 capsule (40 mg total) by mouth 2 (two) times daily. 60 capsule 5   furosemide (LASIX) 40 MG tablet Take 40 mg by mouth every other day.      HYDROcodone-acetaminophen (NORCO/VICODIN) 5-325 MG tablet Take 1-2 tablets by mouth every 6 (six) hours as needed for moderate pain or severe pain. 10 tablet 0   hydrOXYzine (ATARAX) 25 MG tablet Take one to two tablets at bedtime as needed for sleep. 60  tablet 5   ondansetron (ZOFRAN-ODT) 4 MG disintegrating tablet Take 4 mg by mouth every 8 (eight) hours as needed for nausea or vomiting.      prochlorperazine (COMPAZINE) 10 MG tablet Take 10 mg by mouth as needed for nausea or vomiting.      promethazine (PHENERGAN) 25 MG tablet Take 25 mg by mouth every 6 (six) hours as needed for nausea or vomiting.      SUMAtriptan 6 MG/0.5ML SOAJ Inject 6 mg into the skin as needed (migraine).      No current facility-administered medications for this visit.    Medication Side Effects: None  Allergies:  Allergies  Allergen Reactions   Dicyclomine Hives   Peanut Allergen Powder-Dnfp Anaphylaxis   Peanut-Containing Drug Products Anaphylaxis    Other reaction(s): Anaphylaxis   Hydromorphone Hcl Other (See Comments)   Phenytoin Sodium Extended     Decreases blood pressure   Pregabalin Other (See Comments)    Other reaction(s): Other (See Comments) Amnesiac episodes    Sulfa Antibiotics    Hydromorphone Itching    Other reaction(s): Itching   Meloxicam Nausea And Vomiting    Past Medical History:  Diagnosis Date   Anxiety    History of MRSA infection    Hypertension    Seizure ([place]) 2018   2     No family history on file.  Social History   Socioeconomic History   Marital status: Divorced    Spouse name: Not on file   Number of children: Not on file   Years of education: Not on file   Highest education level: Not on file  Occupational History   Not on file  Tobacco Use   Smoking status: Never   Smokeless tobacco: Never  Vaping Use   Vaping Use: Never used  Substance and Sexual Activity   Alcohol use: Yes    Comment: occasional   Drug use: Never   Sexual activity: Not on file  Other Topics Concern   Not on file  Social History Narrative   Not on file   Social Determinants of Health   Financial Resource Strain: Not on file  Food Insecurity: Not on file  Transportation Needs: Not on file  Physical Activity: Not on  file  Stress: Not on file  Social Connections: Not on file  Intimate Partner Violence: Not on file    Past Medical History, Surgical history, Social history, and Family history were reviewed and updated as appropriate.   Please see review of systems for further details on the patient's review from today.   Objective:   Physical Exam:  There were no vitals taken for this visit.  Physical Exam Constitutional:      General: She is not in acute distress. Musculoskeletal:        General: No deformity.  Neurological:     Mental Status: She is alert and oriented to person, place, and time.     Coordination: Coordination normal.  Psychiatric:        Attention and Perception: Attention  and perception normal. She does not perceive auditory or visual hallucinations.        Mood and Affect: Mood normal. Mood is not anxious or depressed. Affect is not labile, blunt, angry or inappropriate.        Speech: Speech normal.        Behavior: Behavior normal.        Thought Content: Thought content normal. Thought content is not paranoid or delusional. Thought content does not include homicidal or suicidal ideation. Thought content does not include homicidal or suicidal plan.        Cognition and Memory: Cognition and memory normal.        Judgment: Judgment normal.     Comments: Insight intact     Lab Review:     Component Value Date/Time   NA 141 05/28/2019 0549   K 3.9 05/28/2019 0549   CL 105 05/28/2019 0549   CO2 27 05/28/2019 0549   GLUCOSE 115 (H) 05/28/2019 0549   BUN 8 05/28/2019 0549   CREATININE 0.89 05/28/2019 0549   CALCIUM 8.7 (L) 05/28/2019 0549   PROT 5.5 (L) 05/28/2019 0549   ALBUMIN 3.0 (L) 05/28/2019 0549   AST 40 05/28/2019 0549   ALT 56 (H) 05/28/2019 0549   ALKPHOS 69 05/28/2019 0549   BILITOT 0.7 05/28/2019 0549   GFRNONAA >60 05/28/2019 0549   GFRAA >60 05/28/2019 0549       Component Value Date/Time   WBC 8.0 05/28/2019 0549   RBC 3.29 (L) 05/28/2019  0549   HGB 10.6 (L) 05/28/2019 0549   HCT 32.9 (L) 05/28/2019 0549   PLT 297 05/28/2019 0549   MCV 100.0 05/28/2019 0549   MCH 32.2 05/28/2019 0549   MCHC 32.2 05/28/2019 0549   RDW 12.7 05/28/2019 0549    No results found for: "POCLITH", "LITHIUM"   No results found for: "PHENYTOIN", "PHENOBARB", "VALPROATE", "CBMZ"   .res Assessment: Plan:    Plan:  Xanax '2mg'$  at hs - bedtime  Clonazepam '2mg'$  BID  Prozac '40mg'$  - 2 daily Adderall '30mg'$  daily   Hydroxyzine '25mg'$  - 1 to 2 at bedtime  Will plan to start taper of Benzodiazepines at next visit.  BP WNL per patient - followed by PCP.  RTC 3 months   Patient advised to contact office with any questions, adverse effects, or acute worsening in signs and symptoms.  Discussed potential benefits, risk, and side effects of benzodiazepines to include potential risk of tolerance and dependence, as well as possible drowsiness.  Advised patient not to drive if experiencing drowsiness and to take lowest possible effective dose to minimize risk of dependence and tolerance.  Discussed potential metabolic side effects associated with atypical antipsychotics, as well as potential risk for movement side effects. Advised pt to contact office if movement side effects occur.  There are no diagnoses linked to this encounter.   Please see After Visit Summary for patient specific instructions.  No future appointments.  No orders of the defined types were placed in this encounter.     -------------------------------

## 2022-06-16 ENCOUNTER — Encounter: Payer: Self-pay | Admitting: Adult Health

## 2022-06-16 ENCOUNTER — Telehealth (INDEPENDENT_AMBULATORY_CARE_PROVIDER_SITE_OTHER): Payer: PRIVATE HEALTH INSURANCE | Admitting: Adult Health

## 2022-06-16 DIAGNOSIS — G4700 Insomnia, unspecified: Secondary | ICD-10-CM

## 2022-06-16 DIAGNOSIS — F331 Major depressive disorder, recurrent, moderate: Secondary | ICD-10-CM | POA: Diagnosis not present

## 2022-06-16 DIAGNOSIS — F410 Panic disorder [episodic paroxysmal anxiety] without agoraphobia: Secondary | ICD-10-CM | POA: Diagnosis not present

## 2022-06-16 DIAGNOSIS — F411 Generalized anxiety disorder: Secondary | ICD-10-CM

## 2022-06-16 DIAGNOSIS — F909 Attention-deficit hyperactivity disorder, unspecified type: Secondary | ICD-10-CM

## 2022-06-16 DIAGNOSIS — F4310 Post-traumatic stress disorder, unspecified: Secondary | ICD-10-CM

## 2022-06-16 MED ORDER — CLONAZEPAM 2 MG PO TABS: 2.0000 mg | ORAL_TABLET | Freq: Two times a day (BID) | ORAL | 2 refills | Status: DC

## 2022-06-16 MED ORDER — ALPRAZOLAM 2 MG PO TABS
2.0000 mg | ORAL_TABLET | Freq: Every evening | ORAL | 2 refills | Status: DC | PRN
Start: 2022-06-16 — End: 2022-09-08

## 2022-06-16 MED ORDER — AMPHETAMINE-DEXTROAMPHETAMINE 30 MG PO TABS
30.0000 mg | ORAL_TABLET | Freq: Every day | ORAL | 0 refills | Status: DC
Start: 2022-07-14 — End: 2022-09-08

## 2022-06-16 MED ORDER — AMPHETAMINE-DEXTROAMPHETAMINE 30 MG PO TABS: 30.0000 mg | ORAL_TABLET | Freq: Every day | ORAL | 0 refills | Status: DC

## 2022-06-16 MED ORDER — HYDROXYZINE HCL 25 MG PO TABS
ORAL_TABLET | ORAL | 5 refills | Status: AC
Start: 2022-06-16 — End: ?

## 2022-06-16 MED ORDER — AMPHETAMINE-DEXTROAMPHETAMINE 30 MG PO TABS
30.0000 mg | ORAL_TABLET | Freq: Every day | ORAL | 0 refills | Status: DC
Start: 2022-08-11 — End: 2022-09-08

## 2022-06-16 MED ORDER — FLUOXETINE HCL 40 MG PO CAPS
40.0000 mg | ORAL_CAPSULE | Freq: Two times a day (BID) | ORAL | 5 refills | Status: DC
Start: 2022-06-16 — End: 2022-12-11
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
## 2022-08-05 ENCOUNTER — Ambulatory Visit: Payer: PRIVATE HEALTH INSURANCE | Admitting: Adult Health

## 2022-09-08 ENCOUNTER — Telehealth: Payer: PRIVATE HEALTH INSURANCE | Admitting: Adult Health

## 2022-09-08 ENCOUNTER — Encounter: Payer: Self-pay | Admitting: Adult Health

## 2022-09-08 DIAGNOSIS — F4310 Post-traumatic stress disorder, unspecified: Secondary | ICD-10-CM

## 2022-09-08 DIAGNOSIS — F410 Panic disorder [episodic paroxysmal anxiety] without agoraphobia: Secondary | ICD-10-CM

## 2022-09-08 DIAGNOSIS — F909 Attention-deficit hyperactivity disorder, unspecified type: Secondary | ICD-10-CM | POA: Diagnosis not present

## 2022-09-08 DIAGNOSIS — F411 Generalized anxiety disorder: Secondary | ICD-10-CM

## 2022-09-08 DIAGNOSIS — G4700 Insomnia, unspecified: Secondary | ICD-10-CM

## 2022-09-08 MED ORDER — AMPHETAMINE-DEXTROAMPHETAMINE 30 MG PO TABS: 30.0000 mg | ORAL_TABLET | Freq: Every day | ORAL | 0 refills | Status: DC

## 2022-09-08 MED ORDER — ALPRAZOLAM 2 MG PO TABS
2.0000 mg | ORAL_TABLET | Freq: Every evening | ORAL | 2 refills | Status: DC | PRN
Start: 2022-09-08 — End: 2022-12-11

## 2022-09-08 MED ORDER — CLONAZEPAM 2 MG PO TABS
2.0000 mg | ORAL_TABLET | Freq: Two times a day (BID) | ORAL | 2 refills | Status: DC
Start: 2022-09-08 — End: 2022-12-11
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
## 2022-11-20 ENCOUNTER — Telehealth: Payer: Self-pay | Admitting: Adult Health

## 2022-11-20 MED ORDER — BUSPIRONE HCL 5 MG PO TABS: 5.0000 mg | ORAL_TABLET | Freq: Three times a day (TID) | ORAL | 0 refills | Status: DC

## 2022-11-20 NOTE — Telephone Encounter (Signed)
Sent Rx for Buspar to requested pharmacy - [facility] in [place]. Patient aware.
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
## 2022-11-20 NOTE — Addendum Note (Signed)
Addended by: Karin Lieu T on: 11/20/2022 04:17 PM   Modules accepted: Orders
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
## 2022-12-01 ENCOUNTER — Telehealth (INDEPENDENT_AMBULATORY_CARE_PROVIDER_SITE_OTHER): Payer: Self-pay | Admitting: Adult Health

## 2022-12-01 DIAGNOSIS — Z0389 Encounter for observation for other suspected diseases and conditions ruled out: Secondary | ICD-10-CM

## 2022-12-01 NOTE — Progress Notes (Signed)
Patient no show video appt. Called - unable to LVM.

## 2022-12-10 ENCOUNTER — Telehealth: Payer: Self-pay | Admitting: Adult Health

## 2022-12-10 NOTE — Telephone Encounter (Signed)
LVM to RC 

## 2022-12-10 NOTE — Telephone Encounter (Signed)
Patient has a MyChart visit tomorrow morning. She didn't realize it. I told her we wouldn't solve anything tonight and it would be better for her to discuss with Almira Coaster tomorrow.

## 2022-12-10 NOTE — Telephone Encounter (Signed)
Patient RTC via lvm at 12:47 today voicing the same concerns.

## 2022-12-10 NOTE — Telephone Encounter (Signed)
Pt LVM @ 12/4 @ 2:48p stating that she needs someone to call her about anxiety issues she's is having.  Next appt 12/6

## 2022-12-11 ENCOUNTER — Telehealth: Payer: PRIVATE HEALTH INSURANCE | Admitting: Adult Health

## 2022-12-11 ENCOUNTER — Encounter: Payer: Self-pay | Admitting: Adult Health

## 2022-12-11 DIAGNOSIS — F410 Panic disorder [episodic paroxysmal anxiety] without agoraphobia: Secondary | ICD-10-CM

## 2022-12-11 DIAGNOSIS — G4709 Other insomnia: Secondary | ICD-10-CM

## 2022-12-11 DIAGNOSIS — F4310 Post-traumatic stress disorder, unspecified: Secondary | ICD-10-CM

## 2022-12-11 DIAGNOSIS — G4700 Insomnia, unspecified: Secondary | ICD-10-CM

## 2022-12-11 DIAGNOSIS — F331 Major depressive disorder, recurrent, moderate: Secondary | ICD-10-CM | POA: Diagnosis not present

## 2022-12-11 DIAGNOSIS — F411 Generalized anxiety disorder: Secondary | ICD-10-CM

## 2022-12-11 MED ORDER — ALPRAZOLAM 2 MG PO TABS
2.0000 mg | ORAL_TABLET | Freq: Every evening | ORAL | 2 refills | Status: DC | PRN
Start: 2022-12-11 — End: 2023-03-11

## 2022-12-11 MED ORDER — FLUOXETINE HCL 40 MG PO CAPS: 40.0000 mg | ORAL_CAPSULE | Freq: Two times a day (BID) | ORAL | 5 refills | Status: AC

## 2022-12-11 MED ORDER — QUETIAPINE FUMARATE 25 MG PO TABS: ORAL_TABLET | ORAL | 2 refills | Status: DC

## 2022-12-11 MED ORDER — CLONAZEPAM 2 MG PO TABS
2.0000 mg | ORAL_TABLET | Freq: Two times a day (BID) | ORAL | 2 refills | Status: DC
Start: 2022-12-11 — End: 2023-03-11
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
## 2022-12-16 ENCOUNTER — Other Ambulatory Visit: Payer: Self-pay

## 2022-12-16 ENCOUNTER — Telehealth: Payer: Self-pay | Admitting: Adult Health

## 2022-12-16 MED ORDER — OLANZAPINE 2.5 MG PO TABS: 2.5000 mg | ORAL_TABLET | Freq: Every day | ORAL | 0 refills | Status: DC

## 2022-12-16 NOTE — Telephone Encounter (Signed)
Pt called and said that she has been taking seroquel 50 mg to help her sleep. It is not working. It is making her nasueous. Please call her at [phone]

## 2022-12-16 NOTE — Telephone Encounter (Signed)
She has tried Trazodone- last year, she had se of weight gain. She takes Lisinopril 80 mg daily.

## 2022-12-16 NOTE — Telephone Encounter (Signed)
She is open to try Olanzapine 2.5 mg at bedtime. Sent to requested pharmacy. 30 day supply 0 rf.

## 2022-12-16 NOTE — Telephone Encounter (Signed)
Dana Kelly reports the second night of taking Seroquel she begun to have nausea, and that it does not help with her sleep. She denies any caffeine late in the afternoon and states she hardly has any throughout the day. She wakes up during the night and finds it is difficult for her to go back to sleep. She sleeps about 2-3 hours of uninterrupted sleep. She states that she is having nightmares. She mentions that she had the nightmares before Seroquel,but since starting the Seroquel the nightmares are more frequent.  Please advise.

## 2022-12-18 ENCOUNTER — Other Ambulatory Visit: Payer: Self-pay | Admitting: Adult Health

## 2022-12-25 ENCOUNTER — Telehealth: Payer: Self-pay | Admitting: Adult Health

## 2022-12-25 NOTE — Telephone Encounter (Signed)
Pt called at 4:30p.  She said the Zyprexza 2.5mg  is not doing anything.  She still cannot sleep.  She said she even tried taking 5.0 because Almira Coaster said she could, but that didn't help either.  She said she was up watching TV. She is asking for an increase.  Send it to   [facility] - [place], [place] - [address] [place] [address] Phone: [phone]  Fax: [phone]   No upcoming appt scheduled.

## 2022-12-28 NOTE — Telephone Encounter (Signed)
Patient still reporting only sleeping 2-3 hours on 5 mg Zyprexa. She has trouble getting to sleep and staying asleep. She takes [place]. Dew. Told her no caffeine after 2 and no screen time before bed. She describes post-concussion syndrome. Do you want to increase dose?

## 2022-12-28 NOTE — Telephone Encounter (Signed)
She has been on various doses of Seroquel, last on 200 mg April 2023. Caused water retention, ankle swelling, and weight gain. Was on up to 300 mg of trazodone in 2023. Has been on benzo, but no controlled sleep medications. Currently on hydroxyzine and xanax.

## 2022-12-28 NOTE — Telephone Encounter (Signed)
LVM to RC 

## 2023-01-04 NOTE — Telephone Encounter (Signed)
Left second VM to [facility].  

## 2023-01-05 NOTE — Telephone Encounter (Signed)
Have left 2 voice mails without return. Sent MyChart message.

## 2023-01-13 ENCOUNTER — Telehealth: Payer: Self-pay | Admitting: Adult Health

## 2023-01-13 NOTE — Telephone Encounter (Signed)
 Pt reporting still not sleeping. She went back to work on Monday. She is not able to function without Adderall. She has trouble getting to sleep and when she does she only sleeps a couple of hours, but is usually able to get back to sleep for another few hours. Sleeping 5-6 hours total.   Stopped Seroquel  when starting olanzapine  2.5 mg.

## 2023-01-13 NOTE — Telephone Encounter (Signed)
 I called pt to schedule follow up appt.  She advised that the medicine Almira Coaster put her on for sleep is not working.  She is asking for call back.  Next appt 3/6

## 2023-01-18 NOTE — Telephone Encounter (Signed)
 Patient notified of recommendations. She reports that she slept better last night. She feels like that maybe getting back into a work routine will help, as she had been out for 2 mo.

## 2023-01-22 ENCOUNTER — Telehealth: Payer: Self-pay | Admitting: Adult Health

## 2023-01-22 NOTE — Telephone Encounter (Signed)
Patient lvm stating she has additional questions concerning her medication. See telephone encounter 01/13/23.

## 2023-01-22 NOTE — Telephone Encounter (Signed)
LVM to RC 

## 2023-01-25 NOTE — Telephone Encounter (Signed)
Left second VM to [facility].  

## 2023-01-27 MED ORDER — OLANZAPINE 2.5 MG PO TABS: 2.5000 mg | ORAL_TABLET | Freq: Every day | ORAL | 0 refills | Status: AC

## 2023-01-27 NOTE — Telephone Encounter (Signed)
Patient requesting RF on olanzapine.  Rx sent to [facility] in [place].

## 2023-02-17 ENCOUNTER — Telehealth: Payer: Self-pay

## 2023-02-17 NOTE — Telephone Encounter (Signed)
Patient called crying. She has been having more PA. She takes clonazepam 2 mg bid but it makes her sleepy. She takes alprazolam 2 mg at bedtime prn. She said it doesn't make her sleepy, but it does calm her down. She said she is making mistakes at work and is in danger of losing her job. She has FU 3/6.

## 2023-02-17 NOTE — Telephone Encounter (Signed)
MyChart message sent to taper lunch time dose of clonazepam from 2 mg to 1.5 mg.

## 2023-02-19 ENCOUNTER — Other Ambulatory Visit: Payer: Self-pay

## 2023-02-19 ENCOUNTER — Telehealth: Payer: Self-pay | Admitting: Adult Health

## 2023-02-19 DIAGNOSIS — F909 Attention-deficit hyperactivity disorder, unspecified type: Secondary | ICD-10-CM

## 2023-02-19 MED ORDER — AMPHETAMINE-DEXTROAMPHETAMINE 30 MG PO TABS
30.0000 mg | ORAL_TABLET | Freq: Every day | ORAL | 0 refills | Status: DC
Start: 2023-02-19 — End: 2023-03-11

## 2023-02-19 NOTE — Telephone Encounter (Signed)
Pended adderall 30 to [facility].

## 2023-02-19 NOTE — Telephone Encounter (Signed)
Pt called asking for a refill on her adderall 30 mg. Pharmacy is [facility] in [place]

## 2023-03-11 ENCOUNTER — Telehealth: Payer: PRIVATE HEALTH INSURANCE | Admitting: Adult Health

## 2023-03-11 ENCOUNTER — Encounter: Payer: Self-pay | Admitting: Adult Health

## 2023-03-11 DIAGNOSIS — F329 Major depressive disorder, single episode, unspecified: Secondary | ICD-10-CM | POA: Diagnosis not present

## 2023-03-11 DIAGNOSIS — F909 Attention-deficit hyperactivity disorder, unspecified type: Secondary | ICD-10-CM | POA: Diagnosis not present

## 2023-03-11 DIAGNOSIS — F410 Panic disorder [episodic paroxysmal anxiety] without agoraphobia: Secondary | ICD-10-CM

## 2023-03-11 DIAGNOSIS — F4310 Post-traumatic stress disorder, unspecified: Secondary | ICD-10-CM

## 2023-03-11 DIAGNOSIS — G4700 Insomnia, unspecified: Secondary | ICD-10-CM | POA: Diagnosis not present

## 2023-03-11 DIAGNOSIS — G4709 Other insomnia: Secondary | ICD-10-CM

## 2023-03-11 DIAGNOSIS — F411 Generalized anxiety disorder: Secondary | ICD-10-CM | POA: Diagnosis not present

## 2023-03-11 MED ORDER — AMPHETAMINE-DEXTROAMPHETAMINE 30 MG PO TABS: 30.0000 mg | ORAL_TABLET | Freq: Every day | ORAL | 0 refills | Status: AC

## 2023-03-11 MED ORDER — QUETIAPINE FUMARATE 25 MG PO TABS: ORAL_TABLET | ORAL | 2 refills | Status: AC

## 2023-03-11 MED ORDER — CLONAZEPAM 1 MG PO TABS: ORAL_TABLET | ORAL | 2 refills | Status: AC

## 2023-03-11 MED ORDER — ALPRAZOLAM 2 MG PO TABS
2.0000 mg | ORAL_TABLET | Freq: Every evening | ORAL | 2 refills | Status: AC | PRN
Start: 2023-03-11 — End: ?
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
## 2023-03-16 ENCOUNTER — Telehealth: Payer: Self-pay | Admitting: Adult Health

## 2023-03-16 NOTE — Telephone Encounter (Signed)
 Pt called at 2:33 requesting to speak to [facility].  Did not want to tell me the nature of her questions.  No upcoming appt scheduled.

## 2023-03-16 NOTE — Telephone Encounter (Signed)
 LVM to [facility]

## 2023-03-17 ENCOUNTER — Other Ambulatory Visit: Payer: Self-pay | Admitting: Adult Health

## 2023-03-17 NOTE — Telephone Encounter (Signed)
 Is she still taking, not on 3/6 note.

## 2023-03-17 NOTE — Telephone Encounter (Signed)
 Left second VM to [facility].

## 2023-03-18 NOTE — Telephone Encounter (Signed)
 Is she taking, not mentioned in notes

## 2023-03-18 NOTE — Telephone Encounter (Signed)
 Sent MyChart messages since no response from VM.

## 2023-03-18 NOTE — Telephone Encounter (Signed)
 I also have a question about Buspar refill.

## 2023-03-25 ENCOUNTER — Telehealth: Payer: Self-pay

## 2023-03-25 NOTE — Telephone Encounter (Signed)
 Received a RF request for Buspar. It was not mentioned in your last note. Have tried several times to reach patient and was unable to until today to ask if she was still taking. She said you didn't mention it and she had not gotten a RF so thought you stopped it.   She also said she had been crying a lot lately. She questions if the fluoxetine

## 2023-03-25 NOTE — Telephone Encounter (Signed)
 Addendum:  She is also reporting lots of crying. She questions if she has been on the Prozac so long that it is not effective and if she needs a higher dose. She said she can't put her finger on anything that would cause her to be crying.  It is difficult to get in touch with her and I have been trying for at least a week. She reports she is a little better today.

## 2023-04-14 ENCOUNTER — Other Ambulatory Visit: Payer: Self-pay | Admitting: Adult Health

## 2023-04-14 DIAGNOSIS — F410 Panic disorder [episodic paroxysmal anxiety] without agoraphobia: Secondary | ICD-10-CM

## 2023-04-14 DIAGNOSIS — F4310 Post-traumatic stress disorder, unspecified: Secondary | ICD-10-CM

## 2023-04-14 DIAGNOSIS — G4700 Insomnia, unspecified: Secondary | ICD-10-CM

## 2023-10-01 DIAGNOSIS — D509 Iron deficiency anemia, unspecified: Secondary | ICD-10-CM | POA: Diagnosis not present
